# Patient Record
Sex: Male | Born: 1945 | Race: Black or African American | Hispanic: No | Smoking: Never smoker
Health system: Southern US, Community
[De-identification: ages and names within clinical notes are randomized; demographics above are authoritative.]

## PROBLEM LIST (undated history)

## (undated) DIAGNOSIS — E119 Type 2 diabetes mellitus without complications: Secondary | ICD-10-CM

## (undated) DIAGNOSIS — E785 Hyperlipidemia, unspecified: Secondary | ICD-10-CM

## (undated) DIAGNOSIS — N289 Disorder of kidney and ureter, unspecified: Secondary | ICD-10-CM

## (undated) DIAGNOSIS — D509 Iron deficiency anemia, unspecified: Secondary | ICD-10-CM

## (undated) DIAGNOSIS — E7800 Pure hypercholesterolemia, unspecified: Secondary | ICD-10-CM

## (undated) DIAGNOSIS — I10 Essential (primary) hypertension: Secondary | ICD-10-CM

## (undated) DIAGNOSIS — U071 COVID-19: Secondary | ICD-10-CM

## (undated) HISTORY — PX: INSERTION OF DIALYSIS CATHETER: SHX1324

## (undated) HISTORY — DX: Pure hypercholesterolemia, unspecified: E78.00

## (undated) HISTORY — PX: OTHER SURGICAL HISTORY: SHX169

## (undated) HISTORY — DX: Essential (primary) hypertension: I10

## (undated) HISTORY — PX: BRAIN SURGERY: SHX531

---

## 2017-01-28 ENCOUNTER — Encounter: Payer: Self-pay | Admitting: Physician Assistant

## 2017-01-28 ENCOUNTER — Ambulatory Visit (INDEPENDENT_AMBULATORY_CARE_PROVIDER_SITE_OTHER): Payer: Medicare Other | Admitting: Physician Assistant

## 2017-01-28 ENCOUNTER — Encounter: Payer: Self-pay | Admitting: *Deleted

## 2017-01-28 VITALS — BP 150/72 | HR 72 | Ht 71.0 in | Wt 248.0 lb

## 2017-01-28 DIAGNOSIS — E785 Hyperlipidemia, unspecified: Secondary | ICD-10-CM | POA: Diagnosis not present

## 2017-01-28 DIAGNOSIS — I890 Lymphedema, not elsewhere classified: Secondary | ICD-10-CM | POA: Diagnosis not present

## 2017-01-28 DIAGNOSIS — R079 Chest pain, unspecified: Secondary | ICD-10-CM | POA: Insufficient documentation

## 2017-01-28 DIAGNOSIS — E108 Type 1 diabetes mellitus with unspecified complications: Secondary | ICD-10-CM

## 2017-01-28 DIAGNOSIS — I10 Essential (primary) hypertension: Secondary | ICD-10-CM | POA: Diagnosis not present

## 2017-01-28 DIAGNOSIS — R0789 Other chest pain: Secondary | ICD-10-CM | POA: Diagnosis not present

## 2017-01-28 DIAGNOSIS — N186 End stage renal disease: Secondary | ICD-10-CM | POA: Diagnosis not present

## 2017-01-28 NOTE — Addendum Note (Signed)
Addended by: Levonne Hubert on: 01/28/2017 02:22 PM   Modules accepted: Orders

## 2017-01-28 NOTE — Patient Instructions (Signed)
Your physician recommends that you schedule a follow-up appointment in: 6 Weeks with Dr. Harl Bowie   Your physician recommends that you continue on your current medications as directed. Please refer to the Current Medication list given to you today.  Your physician has requested that you have an echocardiogram. Echocardiography is a painless test that uses sound waves to create images of your heart. It provides your doctor with information about the size and shape of your heart and how well your heart's chambers and valves are working. This procedure takes approximately one hour. There are no restrictions for this procedure.   Your physician has requested that you have a lexiscan myoview. For further information please visit [email or web address]. Please follow instruction sheet, as given.  If you need a refill on your cardiac medications before your next appointment, please call your pharmacy.  Thank you for choosing [facility]! '

## 2017-01-28 NOTE — Progress Notes (Signed)
Cardiology Office Note    Date:  01/28/2017   ID:  Theodore Nguyen, DOB 12-24-1945, MRN [id number]  PCP:  Theodore Hong, MD  Cardiologist: new  Chief Complaint  Patient presents with  . Chest Pain    History of Present Illness:  Theodore Nguyen is a 71 y.o. male patient referred from [facility] in [place], [place] for evaluation of chest painAnd weakness 12/31/16. Review of records sent show he has end-stage renal disease on hemodialysis, hypertension, DM, hyperlipidemia, chronic lower extremity edema. Chest x-ray 12/31/16 shows mild cardiomegaly and aortic atherosclerosis. EKG 12/31/16 normal sinus rhythm with LVH, left anterior fascicular block. He was given a prescription for meclizine and nitroglycerin and follow-up with [place]. Troponin was 0.08 TSH normal creatinine 7.13.  Patient comes in today alone and complains of sharp shooting chest pains that lasts 4-5 min. Occurs at rest. Sometimes goes under right rib.Has had for several years.Worse episode prompted ER visit. Lasted several days. Walks with a walker and no exertional symptoms. Denies heaviness or pressure. Complains of dyspnea on exertion but does very little. Has chronic lymphedema worse in his right leg but his left leg is wrapped. This limits any activity. Doesn't know his family history.  Past Medical History:  Diagnosis Date  . High cholesterol   . Hypertension     Past Surgical History:  Procedure Laterality Date  . left hand surgery    . right knee repair      Current Medications: Outpatient Medications Prior to Visit  Medication Sig Dispense Refill  . atorvastatin (LIPITOR) 40 MG tablet Take 40 mg by mouth daily.    Marland Kitchen b complex-vitamin c-folic acid (NEPHRO-VITE) 0.8 MG TABS tablet Take 1 tablet by mouth at bedtime.    . carvedilol (COREG) 12.5 MG tablet Take 12.5 mg by mouth 2 (two) times daily with a meal.    . cloNIDine (CATAPRES) 0.3 MG tablet Take 0.3 mg by mouth 2 (two) times daily.    .  enalapril (VASOTEC) 5 MG tablet Take 5 mg by mouth daily.    . furosemide (LASIX) 40 MG tablet Take 40 mg by mouth 2 (two) times daily.    . isosorbide mononitrate (IMDUR) 30 MG 24 hr tablet Take 30 mg by mouth daily.    Marland Kitchen levothyroxine (SYNTHROID, LEVOTHROID) 25 MCG tablet Take 25 mcg by mouth daily before breakfast.    . magnesium oxide (MAG-OX) 400 MG tablet Take 400 mg by mouth daily.    . pantoprazole (PROTONIX) 40 MG tablet Take 40 mg by mouth daily.    . vitamin B-12 (CYANOCOBALAMIN) 1000 MCG tablet Take 1,000 mcg by mouth daily.     No facility-administered medications prior to visit.      Allergies:   Patient has no known allergies.   Social History   Social History  . Marital status: Widowed    Spouse name: N/A  . Number of children: N/A  . Years of education: N/A   Social History Main Topics  . Smoking status: Never Smoker  . Smokeless tobacco: Never Used  . Alcohol use No  . Drug use: No  . Sexual activity: Not Asked   Other Topics Concern  . None   Social History Narrative  . None     Family History:  The patient's   family history includes Cancer in his mother.   ROS:   Please see the history of present illness.    Review of Systems  Constitution: Positive for weakness and  malaise/fatigue.  HENT: Negative.   Cardiovascular: Positive for dyspnea on exertion and leg swelling.  Respiratory: Negative.   Endocrine: Negative.   Hematologic/Lymphatic: Positive for adenopathy.  Skin: Positive for poor wound healing.  Musculoskeletal: Positive for joint swelling and muscle weakness.  Gastrointestinal: Negative.   Genitourinary: Negative.        On hemodialysis   All other systems reviewed and are negative.   PHYSICAL EXAM:   VS:  BP (!) 150/72 (BP Location: Left Arm)   Pulse 72   Ht 5\' 11"  (1.803 m)   Wt 248 lb (112.5 kg)   SpO2 99%   BMI 34.59 kg/m   Physical Exam  GEN: Obese, in a wheelchair, in no acute distress  Neck: no JVD, carotid bruits, or  masses Cardiac:RRR; positive S4, 2/6 systolic murmur at the apex  Respiratory:  clear to auscultation bilaterally, normal work of breathing GI: soft, nontender, nondistended, + BS Ext: Significant lymphedema of his right lower extremity up to his knees, left lymphedema but leg wrapped  Psych: euthymic mood, full affect  Wt Readings from Last 3 Encounters:  01/28/17 248 lb (112.5 kg)      Studies/Labs Reviewed:   EKG:  EKG is  ordered today.  The ekg ordered today demonstrates Normal sinus rhythm with LVH, poor R wave progression anteriorly nonspecific ST-T wave changes, no acute change  Recent Labs: No results found for requested labs within last 8760 hours.   Lipid Panel No results found for: CHOL, TRIG, HDL, CHOLHDL, VLDL, LDLCALC, LDLDIRECT  Additional studies/ records that were reviewed today include:   See dictation above for review of records   ASSESSMENT:    1. Other chest pain   2. ESRD (end stage renal disease) ([facility])   3. Essential hypertension   4. Type 1 diabetes mellitus with complications (H[facility])   5. Hyperlipidemia, unspecified hyperlipidemia type      PLAN:  In order of problems listed above:  Chest pain somewhat atypical but multiple cardiac risk factors for CAD, sedentary with end-stage renal disease and abnormal EKG. Recommend Lexi scan and 2-D echo. Discuss with Dr. branch who concurs. Follow-up with him in 4-6 weeks.  End-stage renal disease on hemodialysis Tuesday Thursday Saturday  Essential hypertension blood pressure is up a little today but he did not have dialysis today. He is unaware of his medications and his daughter left him here and took his meds with her  Diabetes mellitus  Hyperlipidemia on Lipitor  Lymphedema with left leg wrapped significant on his right lower extremity. Patient says he's had for 20 years.    Medication Adjustments/Labs and Tests Ordered: Current medicines are reviewed at length with the patient today.  Concerns  regarding medicines are outlined above.  Medication changes, Labs and Tests ordered today are listed in the Patient Instructions below. Patient Instructions  Your physician recommends that you schedule a follow-up appointment in: 6 Weeks with Dr. Harl Bowie   Your physician recommends that you continue on your current medications as directed. Please refer to the Current Medication list given to you today.  Your physician has requested that you have an echocardiogram. Echocardiography is a painless test that uses sound waves to create images of your heart. It provides your doctor with information about the size and shape of your heart and how well your heart's chambers and valves are working. This procedure takes approximately one hour. There are no restrictions for this procedure.   Your physician has requested that you have a lexiscan  myoview. For further information please visit [email or web address]. Please follow instruction sheet, as given.  If you need a refill on your cardiac medications before your next appointment, please call your pharmacy.  Thank you for choosing [facility]! '      Signed, Ermalinda Barrios, Hershal Coria  01/28/2017 1:59 PM    [facility] [address], [place], [place]  [address] Phone: [phone]; Fax: [phone]

## 2017-02-06 ENCOUNTER — Inpatient Hospital Stay (HOSPITAL_COMMUNITY): Admission: RE | Admit: 2017-02-06 | Payer: Medicare Other | Source: Ambulatory Visit

## 2017-02-06 ENCOUNTER — Other Ambulatory Visit (HOSPITAL_COMMUNITY): Payer: Medicare Other

## 2017-02-06 ENCOUNTER — Encounter (HOSPITAL_COMMUNITY): Payer: Medicare Other

## 2017-02-11 ENCOUNTER — Ambulatory Visit (HOSPITAL_COMMUNITY)
Admission: RE | Admit: 2017-02-11 | Discharge: 2017-02-11 | Disposition: A | Discharge status: Home / Self Care | Payer: Medicare Other | Source: Ambulatory Visit | Attending: Physician Assistant | Admitting: Physician Assistant

## 2017-02-11 ENCOUNTER — Encounter (HOSPITAL_COMMUNITY)
Admission: RE | Admit: 2017-02-11 | Discharge: 2017-02-11 | Disposition: A | Discharge status: Home / Self Care | Payer: Medicare Other | Source: Ambulatory Visit | Attending: Physician Assistant | Admitting: Physician Assistant

## 2017-02-11 ENCOUNTER — Inpatient Hospital Stay (HOSPITAL_COMMUNITY): Admission: RE | Admit: 2017-02-11 | Payer: Medicare Other | Source: Ambulatory Visit

## 2017-02-11 ENCOUNTER — Encounter (HOSPITAL_COMMUNITY): Payer: Self-pay

## 2017-02-11 DIAGNOSIS — R0789 Other chest pain: Secondary | ICD-10-CM

## 2017-02-11 DIAGNOSIS — I700 Atherosclerosis of aorta: Secondary | ICD-10-CM

## 2017-02-11 DIAGNOSIS — R072 Precordial pain: Secondary | ICD-10-CM | POA: Diagnosis not present

## 2017-02-11 LAB — NM MYOCAR MULTI W/SPECT W/WALL MOTION / EF
CHL CUP NUCLEAR SRS: 5
CHL CUP NUCLEAR SSS: 5
LHR: 0.43
LV dias vol: 162 mL (ref 62–150)
LV sys vol: 83 mL
NUC STRESS TID: 1.22
Peak HR: 123 {beats}/min
Rest HR: 85 {beats}/min
SDS: 0

## 2017-02-11 MED ORDER — TECHNETIUM TC 99M TETROFOSMIN IV KIT
10.0000 | PACK | Freq: Once | INTRAVENOUS | Status: AC | PRN
  Administered 2017-02-11: 10.8 via INTRAVENOUS

## 2017-02-11 MED ORDER — SODIUM CHLORIDE 0.9% FLUSH
INTRAVENOUS | Status: AC
  Administered 2017-02-11: 10 mL via INTRAVENOUS
  Filled 2017-02-11: qty 10

## 2017-02-11 MED ORDER — TECHNETIUM TC 99M TETROFOSMIN IV KIT
30.0000 | PACK | Freq: Once | INTRAVENOUS | Status: AC | PRN
  Administered 2017-02-11: 30 via INTRAVENOUS

## 2017-02-11 MED ORDER — REGADENOSON 0.4 MG/5ML IV SOLN
INTRAVENOUS | Status: AC
  Administered 2017-02-11: 0.4 mg via INTRAVENOUS
  Filled 2017-02-11: qty 5

## 2017-03-02 ENCOUNTER — Telehealth: Payer: Self-pay | Admitting: Physician Assistant

## 2017-03-02 NOTE — Telephone Encounter (Signed)
Theodore Nguyen is calling and would like to know if our office could give orders for patient to have physical therapy. Please call at [phone]. Thanks.

## 2017-03-02 NOTE — Telephone Encounter (Signed)
Returned call to [place] with [facility]-states they received a request from patients dialysis center for PT and they are unable to get orders from PCP as PCP is at the [place] in [place].   Also states dialysis will not provide orders for PT as well.  Reports patient is having trouble getting to and from the bus for transportation and also having trouble with transferring at dialysis.    Advised I would route to PA to see if ok to approve orders.  Verbalized understanding.

## 2017-03-03 NOTE — Telephone Encounter (Signed)
[name] made aware-verbal order, will fax over to have PA sign.

## 2017-03-03 NOTE — Telephone Encounter (Signed)
I'm fine if you want to order PT. It's not something I typically do so let me know what I need to do unless you can just send the order in.

## 2017-03-18 ENCOUNTER — Ambulatory Visit: Payer: Medicare Other | Admitting: Cardiology

## 2017-03-18 NOTE — Progress Notes (Deleted)
Clinical Summary Mr. Bade is a 71 y.o.male last seen by PA Lenze, this is our first visit together. He is seen for the following medical problems.    1. Chest pain - seen at [facility] for chest pain Jan 2018. Described as atypical symptoms.  - 02/2017 nuclear stress test with prior inferior scar, no active ischemia.    2. ESRD   3. HTN   4. DM2   5. Hyperlipidemia   6. Chronic lower extremity edema.  Past Medical History:  Diagnosis Date  . High cholesterol   . Hypertension      No Known Allergies   Current Outpatient Prescriptions  Medication Sig Dispense Refill  . atorvastatin (LIPITOR) 40 MG tablet Take 40 mg by mouth daily.    Marland Kitchen b complex-vitamin c-folic acid (NEPHRO-VITE) 0.8 MG TABS tablet Take 1 tablet by mouth at bedtime.    . cinacalcet (SENSIPAR) 30 MG tablet Take by mouth.    . cloNIDine (CATAPRES) 0.3 MG tablet Take 0.3 mg by mouth 2 (two) times daily.    . insulin aspart (NOVOLOG) 100 UNIT/ML FlexPen Using sliding scale. If 150-200, give 3 units, if 201-250, give 5 units, if 251-300 give 7 units, if 301-350, give 9 units, if 351-400, give 11 units, if >400, give 13 units    . insulin glargine (LANTUS) 100 UNIT/ML injection Inject into the skin at bedtime.    . isosorbide mononitrate (IMDUR) 30 MG 24 hr tablet Take 30 mg by mouth daily.    Marland Kitchen levothyroxine (SYNTHROID, LEVOTHROID) 25 MCG tablet Take 25 mcg by mouth daily before breakfast.    . omeprazole (PRILOSEC) 20 MG capsule Take 20 mg by mouth daily.    . sevelamer carbonate (RENVELA) 800 MG tablet Take 800 mg by mouth 3 (three) times daily with meals.    . vitamin B-12 (CYANOCOBALAMIN) 1000 MCG tablet Take 1,000 mcg by mouth daily.    . Vitamin D, Ergocalciferol, (DRISDOL) 50000 units CAPS capsule Take by mouth.     No current facility-administered medications for this visit.      Past Surgical History:  Procedure Laterality Date  . left hand surgery    . right knee repair       No  Known Allergies    Family History  Problem Relation Age of Onset  . Cancer Mother      Social History Mr. Canal reports that he has never smoked. He has never used smokeless tobacco. Mr. Betten reports that he does not drink alcohol.   Review of Systems CONSTITUTIONAL: No weight loss, fever, chills, weakness or fatigue.  HEENT: Eyes: No visual loss, blurred vision, double vision or yellow sclerae.No hearing loss, sneezing, congestion, runny nose or sore throat.  SKIN: No rash or itching.  CARDIOVASCULAR:  RESPIRATORY: No shortness of breath, cough or sputum.  GASTROINTESTINAL: No anorexia, nausea, vomiting or diarrhea. No abdominal pain or blood.  GENITOURINARY: No burning on urination, no polyuria NEUROLOGICAL: No headache, dizziness, syncope, paralysis, ataxia, numbness or tingling in the extremities. No change in bowel or bladder control.  MUSCULOSKELETAL: No muscle, back pain, joint pain or stiffness.  LYMPHATICS: No enlarged nodes. No history of splenectomy.  PSYCHIATRIC: No history of depression or anxiety.  ENDOCRINOLOGIC: No reports of sweating, cold or heat intolerance. No polyuria or polydipsia.  Marland Kitchen   Physical Examination There were no vitals filed for this visit. There were no vitals filed for this visit.  Gen: resting comfortably, no acute distress HEENT:  no scleral icterus, pupils equal round and reactive, no palptable cervical adenopathy,  CV Resp: Clear to auscultation bilaterally GI: abdomen is soft, non-tender, non-distended, normal bowel sounds, no hepatosplenomegaly MSK: extremities are warm, no edema.  Skin: warm, no rash Neuro:  no focal deficits Psych: appropriate affect   Diagnostic Studies  02/2017 Lexiscsan  There was no ST segment deviation noted during stress.  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  This is a low risk study. There is no myocardium currently at jeopardy.  Nuclear stress EF: 50%. LVEF is low  normal.   Assessment and Plan        Arnoldo Lenis, M.D., F.A.C.C.

## 2019-08-16 ENCOUNTER — Emergency Department (HOSPITAL_COMMUNITY): Payer: Medicare Other

## 2019-08-16 ENCOUNTER — Encounter (HOSPITAL_COMMUNITY): Payer: Self-pay

## 2019-08-16 ENCOUNTER — Other Ambulatory Visit: Payer: Self-pay

## 2019-08-16 ENCOUNTER — Inpatient Hospital Stay (HOSPITAL_COMMUNITY)
Admission: EM | Admit: 2019-08-16 | Discharge: 2019-08-26 | DRG: 177 | Disposition: A | Discharge status: Skilled Nursing Facility | Payer: Medicare Other | Source: Skilled Nursing Facility | Attending: Internal Medicine | Admitting: Internal Medicine

## 2019-08-16 DIAGNOSIS — Z794 Long term (current) use of insulin: Secondary | ICD-10-CM | POA: Diagnosis not present

## 2019-08-16 DIAGNOSIS — E875 Hyperkalemia: Secondary | ICD-10-CM | POA: Diagnosis not present

## 2019-08-16 DIAGNOSIS — N2581 Secondary hyperparathyroidism of renal origin: Secondary | ICD-10-CM | POA: Diagnosis not present

## 2019-08-16 DIAGNOSIS — I120 Hypertensive chronic kidney disease with stage 5 chronic kidney disease or end stage renal disease: Secondary | ICD-10-CM | POA: Diagnosis present

## 2019-08-16 DIAGNOSIS — J1289 Other viral pneumonia: Secondary | ICD-10-CM | POA: Diagnosis present

## 2019-08-16 DIAGNOSIS — J90 Pleural effusion, not elsewhere classified: Secondary | ICD-10-CM

## 2019-08-16 DIAGNOSIS — E8770 Fluid overload, unspecified: Secondary | ICD-10-CM | POA: Diagnosis present

## 2019-08-16 DIAGNOSIS — I10 Essential (primary) hypertension: Secondary | ICD-10-CM | POA: Diagnosis not present

## 2019-08-16 DIAGNOSIS — N186 End stage renal disease: Secondary | ICD-10-CM | POA: Diagnosis present

## 2019-08-16 DIAGNOSIS — R0602 Shortness of breath: Secondary | ICD-10-CM | POA: Diagnosis present

## 2019-08-16 DIAGNOSIS — D631 Anemia in chronic kidney disease: Secondary | ICD-10-CM

## 2019-08-16 DIAGNOSIS — E7800 Pure hypercholesterolemia, unspecified: Secondary | ICD-10-CM | POA: Diagnosis present

## 2019-08-16 DIAGNOSIS — U071 COVID-19: Secondary | ICD-10-CM | POA: Diagnosis present

## 2019-08-16 DIAGNOSIS — E1122 Type 2 diabetes mellitus with diabetic chronic kidney disease: Secondary | ICD-10-CM

## 2019-08-16 DIAGNOSIS — L89302 Pressure ulcer of unspecified buttock, stage 2: Secondary | ICD-10-CM | POA: Diagnosis not present

## 2019-08-16 DIAGNOSIS — Z992 Dependence on renal dialysis: Secondary | ICD-10-CM

## 2019-08-16 DIAGNOSIS — L89152 Pressure ulcer of sacral region, stage 2: Secondary | ICD-10-CM | POA: Diagnosis present

## 2019-08-16 DIAGNOSIS — L8942 Pressure ulcer of contiguous site of back, buttock and hip, stage 2: Secondary | ICD-10-CM | POA: Diagnosis not present

## 2019-08-16 DIAGNOSIS — E785 Hyperlipidemia, unspecified: Secondary | ICD-10-CM | POA: Diagnosis present

## 2019-08-16 DIAGNOSIS — L8990 Pressure ulcer of unspecified site, unspecified stage: Secondary | ICD-10-CM | POA: Insufficient documentation

## 2019-08-16 DIAGNOSIS — F329 Major depressive disorder, single episode, unspecified: Secondary | ICD-10-CM | POA: Diagnosis not present

## 2019-08-16 DIAGNOSIS — J9601 Acute respiratory failure with hypoxia: Secondary | ICD-10-CM | POA: Diagnosis present

## 2019-08-16 DIAGNOSIS — I959 Hypotension, unspecified: Secondary | ICD-10-CM

## 2019-08-16 DIAGNOSIS — N189 Chronic kidney disease, unspecified: Secondary | ICD-10-CM

## 2019-08-16 HISTORY — DX: Disorder of kidney and ureter, unspecified: N28.9

## 2019-08-16 HISTORY — DX: Hyperlipidemia, unspecified: E78.5

## 2019-08-16 HISTORY — DX: Iron deficiency anemia, unspecified: D50.9

## 2019-08-16 HISTORY — DX: Type 2 diabetes mellitus without complications: E11.9

## 2019-08-16 LAB — CBC WITH DIFFERENTIAL/PLATELET
Abs Immature Granulocytes: 0.01 10*3/uL (ref 0.00–0.07)
Basophils Absolute: 0 10*3/uL (ref 0.0–0.1)
Basophils Relative: 0 %
Eosinophils Absolute: 0 10*3/uL (ref 0.0–0.5)
Eosinophils Relative: 0 %
HCT: 38.3 % — ABNORMAL LOW (ref 39.0–52.0)
Hemoglobin: 10.1 g/dL — ABNORMAL LOW (ref 13.0–17.0)
Immature Granulocytes: 0 %
Lymphocytes Relative: 8 %
Lymphs Abs: 0.3 10*3/uL — ABNORMAL LOW (ref 0.7–4.0)
MCH: 26.4 pg (ref 26.0–34.0)
MCHC: 26.4 g/dL — ABNORMAL LOW (ref 30.0–36.0)
MCV: 100 fL (ref 80.0–100.0)
Monocytes Absolute: 0.2 10*3/uL (ref 0.1–1.0)
Monocytes Relative: 6 %
Neutro Abs: 3 10*3/uL (ref 1.7–7.7)
Neutrophils Relative %: 86 %
Platelets: 147 10*3/uL — ABNORMAL LOW (ref 150–400)
RBC: 3.83 MIL/uL — ABNORMAL LOW (ref 4.22–5.81)
RDW: 17 % — ABNORMAL HIGH (ref 11.5–15.5)
WBC: 3.5 10*3/uL — ABNORMAL LOW (ref 4.0–10.5)
nRBC: 0 % (ref 0.0–0.2)

## 2019-08-16 LAB — LACTIC ACID, PLASMA: Lactic Acid, Venous: 0.5 mmol/L (ref 0.5–1.9)

## 2019-08-16 LAB — COMPREHENSIVE METABOLIC PANEL
ALT: 9 U/L (ref 0–44)
AST: 12 U/L — ABNORMAL LOW (ref 15–41)
Albumin: 2.6 g/dL — ABNORMAL LOW (ref 3.5–5.0)
Alkaline Phosphatase: 100 U/L (ref 38–126)
Anion gap: 10 (ref 5–15)
BUN: 24 mg/dL — ABNORMAL HIGH (ref 8–23)
CO2: 27 mmol/L (ref 22–32)
Calcium: 8.2 mg/dL — ABNORMAL LOW (ref 8.9–10.3)
Chloride: 99 mmol/L (ref 98–111)
Creatinine, Ser: 3.33 mg/dL — ABNORMAL HIGH (ref 0.61–1.24)
GFR calc Af Amer: 20 mL/min — ABNORMAL LOW (ref >60)
GFR calc non Af Amer: 17 mL/min — ABNORMAL LOW (ref >60)
Glucose, Bld: 75 mg/dL (ref 70–99)
Potassium: 4.4 mmol/L (ref 3.5–5.1)
Sodium: 136 mmol/L (ref 135–145)
Total Bilirubin: 0.7 mg/dL (ref 0.3–1.2)
Total Protein: 6.1 g/dL — ABNORMAL LOW (ref 6.5–8.1)

## 2019-08-16 LAB — SARS CORONAVIRUS 2 BY RT PCR (HOSPITAL ORDER, PERFORMED IN ~~LOC~~ HOSPITAL LAB): SARS Coronavirus 2: POSITIVE — AB

## 2019-08-16 LAB — BRAIN NATRIURETIC PEPTIDE: B Natriuretic Peptide: 215 pg/mL — ABNORMAL HIGH (ref 0.0–100.0)

## 2019-08-16 MED ORDER — DEXAMETHASONE SODIUM PHOSPHATE 10 MG/ML IJ SOLN
6.0000 mg | INTRAMUSCULAR | Status: DC
  Administered 2019-08-16 – 2019-08-22 (×7): 6 mg via INTRAVENOUS
  Filled 2019-08-16 (×7): qty 1

## 2019-08-16 MED ORDER — ONDANSETRON HCL 4 MG PO TABS: 4.0000 mg | ORAL_TABLET | Freq: Four times a day (QID) | ORAL | Status: DC | PRN

## 2019-08-16 MED ORDER — ENSURE ENLIVE PO LIQD
237.0000 mL | Freq: Two times a day (BID) | ORAL | Status: DC
  Administered 2019-08-17: 237 mL via ORAL

## 2019-08-16 MED ORDER — PANTOPRAZOLE SODIUM 40 MG PO TBEC
40.0000 mg | DELAYED_RELEASE_TABLET | Freq: Every day | ORAL | Status: DC
  Administered 2019-08-17 – 2019-08-26 (×10): 40 mg via ORAL
  Filled 2019-08-16 (×10): qty 1

## 2019-08-16 MED ORDER — HEPARIN SODIUM (PORCINE) 5000 UNIT/ML IJ SOLN
5000.0000 [IU] | Freq: Three times a day (TID) | INTRAMUSCULAR | Status: DC
  Administered 2019-08-16 – 2019-08-26 (×28): 5000 [IU] via SUBCUTANEOUS
  Filled 2019-08-16 (×28): qty 1

## 2019-08-16 MED ORDER — ACETAMINOPHEN 325 MG PO TABS
650.0000 mg | ORAL_TABLET | Freq: Four times a day (QID) | ORAL | Status: DC | PRN
  Administered 2019-08-16 – 2019-08-25 (×4): 650 mg via ORAL
  Filled 2019-08-16 (×4): qty 2

## 2019-08-16 MED ORDER — ONDANSETRON HCL 4 MG/2ML IJ SOLN: 4.0000 mg | Freq: Four times a day (QID) | INTRAMUSCULAR | Status: DC | PRN

## 2019-08-16 MED ORDER — ACETAMINOPHEN 650 MG RE SUPP: 650.0000 mg | Freq: Four times a day (QID) | RECTAL | Status: DC | PRN

## 2019-08-16 MED ORDER — SERTRALINE HCL 25 MG PO TABS
25.0000 mg | ORAL_TABLET | Freq: Every day | ORAL | Status: DC
  Administered 2019-08-17 – 2019-08-26 (×10): 25 mg via ORAL
  Filled 2019-08-16 (×10): qty 1

## 2019-08-16 MED ORDER — GABAPENTIN 100 MG PO CAPS
100.0000 mg | ORAL_CAPSULE | Freq: Three times a day (TID) | ORAL | Status: DC
  Administered 2019-08-16 – 2019-08-26 (×29): 100 mg via ORAL
  Filled 2019-08-16 (×29): qty 1

## 2019-08-16 MED ORDER — ASPIRIN EC 81 MG PO TBEC
81.0000 mg | DELAYED_RELEASE_TABLET | Freq: Every day | ORAL | Status: DC
  Administered 2019-08-17 – 2019-08-26 (×10): 81 mg via ORAL
  Filled 2019-08-16 (×10): qty 1

## 2019-08-16 MED ORDER — SODIUM CHLORIDE 0.9 % IV BOLUS
250.0000 mL | Freq: Once | INTRAVENOUS | Status: AC
  Administered 2019-08-16: 15:00:00 250 mL via INTRAVENOUS

## 2019-08-16 MED ORDER — ATORVASTATIN CALCIUM 40 MG PO TABS
40.0000 mg | ORAL_TABLET | Freq: Every day | ORAL | Status: DC
  Administered 2019-08-17 – 2019-08-26 (×10): 40 mg via ORAL
  Filled 2019-08-16 (×10): qty 1

## 2019-08-16 NOTE — ED Notes (Signed)
Patient transported to CT 

## 2019-08-16 NOTE — H&P (Addendum)
History and Physical    Theodore Nguyen [id number] DOB: 11/05/1946 DOA: 08/16/2019  PCP: Eber Hong, MD   Patient coming from: SNF.   Chief Complaint: Hypotension on hemodialysis.   HPI: Theodore Nguyen is a 73 y.o. male with medical history significant of type 2 diabetes mellitus, end-stage renal disease on hemodialysis, (Tuesday Thursday, Saturday), hypertension, dyslipidemia and iron deficiency anemia.  Patient lives at a nursing facility where multiple residents have been tested positive for COVID-19.  He has been nonambulatory for about a year due to generalized weakness.   He reports not feeling well for about 2 days, generalized weakness, moderate to severe intensity, no improving or worsening factors, no associated cough, fevers or chills.  He does have mild dyspnea.  Today he went to hemodialysis where he was found hypotensive while receiving hemodialysis.  EMS was called and reported patient oxygen saturation 83% on room air.  His hemodialysis was stopped prematurely and transferred to the hospital for further evaluation.   ED Course: Patient was hypotensive 93/54, oxygen saturation was 88% on room air, he tested positive for SARS COVID-19.  Referred for admission for evaluation.  Review of Systems:  1. General: No fevers, no chills, no weight gain or weight loss 2. ENT: No runny nose or sore throat, no hearing disturbances 3. Pulmonary: Positive dyspnea, but no cough, wheezing, or hemoptysis 4. Cardiovascular: No angina, claudication, lower extremity edema, pnd or orthopnea 5. Gastrointestinal: mild nausea but no vomiting, no diarrhea or constipation 6. Hematology: No easy bruisability or frequent infections 7. Urology: No dysuria, hematuria or increased urinary frequency 8. Dermatology: No rashes. 9. Neurology: No seizures or paresthesias 10. Musculoskeletal: No joint pain or deformities  Past Medical History:  Diagnosis Date  . Diabetes mellitus without  complication ([place])   . High cholesterol   . Hyperlipidemia   . Hypertension   . Iron deficiency anemia   . Renal disorder    dialysis    Past Surgical History:  Procedure Laterality Date  . INSERTION OF DIALYSIS CATHETER    . left hand surgery    . right knee repair       reports that he has never smoked. He has never used smokeless tobacco. He reports that he does not drink alcohol or use drugs.  No Known Allergies  Family History  Problem Relation Age of Onset  . Cancer Mother      Prior to Admission medications   Medication Sig Start Date End Date Taking? Authorizing Provider  acetaminophen (TYLENOL 8 HOUR) 650 MG CR tablet Take 650 mg by mouth every 6 (six) hours.   Yes [provider]  amLODipine (NORVASC) 5 MG tablet Take 5 mg by mouth daily.   Yes [provider]  aspirin EC 81 MG tablet Take 81 mg by mouth daily.   Yes [provider]  atorvastatin (LIPITOR) 40 MG tablet Take 40 mg by mouth daily.   Yes [provider]  b complex-vitamin c-folic acid (NEPHRO-VITE) 0.8 MG TABS tablet Take 1 tablet by mouth every morning.    Yes [provider]  carvedilol (COREG) 6.25 MG tablet Take 6.25 mg by mouth daily.   Yes [provider]  Cholecalciferol (VITAMIN D3) 50 MCG (2000 UT) TABS Take 2,000 Units by mouth daily.   Yes [provider]  gabapentin (NEURONTIN) 100 MG capsule Take 100 mg by mouth 3 (three) times daily.   Yes [provider]  isosorbide mononitrate (IMDUR) 30 MG 24 hr tablet Take  30 mg by mouth daily. HOLD FOR SYST9OLIC LEVELS [id number]   Yes [provider]  losartan (COZAAR) 50 MG tablet Take 50 mg by mouth every Monday, Wednesday, and Friday.   Yes [provider]  Multiple Vitamin (MULTIVITAMIN WITH MINERALS) TABS tablet Take 1 tablet by mouth daily.   Yes [provider]  omeprazole (PRILOSEC) 40 MG capsule Take 40 mg by mouth 2 (two) times daily before a meal.     Yes [provider]  ondansetron (ZOFRAN) 4 MG tablet Take 4 mg by mouth every 8 (eight) hours as needed for nausea or vomiting.   Yes [provider]  sertraline (ZOLOFT) 25 MG tablet Take 25 mg by mouth daily.   Yes [provider]    Physical Exam: Vitals:   08/16/19 1630 08/16/19 1700 08/16/19 1730 08/16/19 1836  BP: 107/75 96/67 96/65  121/71  Pulse:  (!) 57 (!) 48 76  Resp: 11 12 11 14   Temp:    98 F (36.7 C)  TempSrc:    Oral  SpO2:  96% 92% 93%  Weight:      Height:        Vitals:   08/16/19 1630 08/16/19 1700 08/16/19 1730 08/16/19 1836  BP: 107/75 96/67 96/65  121/71  Pulse:  (!) 57 (!) 48 76  Resp: 11 12 11 14   Temp:    98 F (36.7 C)  TempSrc:    Oral  SpO2:  96% 92% 93%  Weight:      Height:       General: deconditioned and ill looking appearing.  Neurology: Awake and alert, non focal Head and Neck. Head normocephalic. Neck supple with no adenopathy or thyromegaly.   E ENT: mild pallor, no icterus, oral mucosa moist Cardiovascular: No JVD. S1-S2 present, rhythmic, no gallops, rubs, or murmurs. Trace bilateral lower extremity edema. Pulmonary: positive breath sounds bilaterally, decreased air movement at the left base, no wheezing, rhonchi or rales. Gastrointestinal. Abdomen with no organomegaly, non tender, no rebound or guarding Skin. No rashes Musculoskeletal: no joint deformities    Labs on Admission: I have personally reviewed following labs and imaging studies  CBC: Recent Labs  Lab 08/16/19 1502  WBC 3.5*  NEUTROABS 3.0  HGB 10.1*  HCT 38.3*  MCV 100.0  PLT [id number]*   Basic Metabolic Panel: Recent Labs  Lab 08/16/19 1502  NA 136  K 4.4  CL 99  CO2 27  GLUCOSE 75  BUN 24*  CREATININE 3.33*  CALCIUM 8.2*   GFR: Estimated Creatinine Clearance: 24.1 mL/min (A) (by C-G formula based on SCr of 3.33 mg/dL (H)). Liver Function Tests: Recent Labs  Lab 08/16/19 1502  AST 12*  ALT 9  ALKPHOS 100  BILITOT 0.7   PROT 6.1*  ALBUMIN 2.6*   No results for input(s): LIPASE, AMYLASE in the last 168 hours. No results for input(s): AMMONIA in the last 168 hours. Coagulation Profile: No results for input(s): INR, PROTIME in the last 168 hours. Cardiac Enzymes: No results for input(s): CKTOTAL, CKMB, CKMBINDEX, TROPONINI in the last 168 hours. BNP (last 3 results) No results for input(s): PROBNP in the last 8760 hours. HbA1C: No results for input(s): HGBA1C in the last 72 hours. CBG: No results for input(s): GLUCAP in the last 168 hours. Lipid Profile: No results for input(s): CHOL, HDL, LDLCALC, TRIG, CHOLHDL, LDLDIRECT in the last 72 hours. Thyroid Function Tests: No results for input(s): TSH, T4TOTAL, FREET4, T3FREE, THYROIDAB in the last 72 hours. Anemia Panel:  No results for input(s): VITAMINB12, FOLATE, FERRITIN, TIBC, IRON, RETICCTPCT in the last 72 hours. Urine analysis: No results found for: COLORURINE, APPEARANCEUR, [place], [place], GLUCOSEU, HGBUR, BILIRUBINUR, KETONESUR, PROTEINUR, UROBILINOGEN, NITRITE, LEUKOCYTESUR  Radiological Exams on Admission: Ct Chest Wo Contrast  Result Date: 08/16/2019 CLINICAL DATA:  Chest pain, shortness of breath. EXAM: CT CHEST WITHOUT CONTRAST TECHNIQUE: Multidetector CT imaging of the chest was performed following the standard protocol without IV contrast. COMPARISON:  Radiographs of same day. FINDINGS: Cardiovascular: Atherosclerosis of thoracic aorta is noted. 4 cm ascending thoracic aortic aneurysm is noted. Dissection could not be evaluated for due to lack of intravenous contrast. Mild cardiomegaly is noted. No pericardial effusion is noted. Extensive coronary artery calcifications are noted. Mediastinum/Nodes: No enlarged mediastinal or axillary lymph nodes. Thyroid gland, trachea, and esophagus demonstrate no significant findings. Lungs/Pleura: No pneumothorax is noted. Large left pleural effusion is noted with complete atelectasis of the left lower lobe  and significant atelectasis of the left upper lobe. Mild right pleural effusion is noted with adjacent atelectasis of the right lower lobe. Upper Abdomen: No acute abnormality. Musculoskeletal: Increased sclerosis of visualized skeleton is noted consistent with renal osteodystrophy. IMPRESSION: Large left pleural effusion is noted with complete atelectasis of the left lower lobe and significant atelectasis of the left upper lobe. Small right pleural effusion is noted with adjacent subsegmental atelectasis of the right lower lobe. 4 cm ascending thoracic aortic aneurysm. Recommend annual imaging followup by CTA or MRA. This recommendation follows 2010 ACCF/AHA/AATS/ACR/ASA/SCA/SCAI/SIR/STS/SVM Guidelines for the Diagnosis and Management of Patients with Thoracic Aortic Disease. Circulation. 2010; [id number]. Aortic aneurysm NOS (ICD10-I71.9). Extensive coronary artery calcifications are noted concerning for coronary artery disease. Aortic Atherosclerosis (ICD10-I70.0). Electronically Signed   By: Marijo Conception M.D.   On: 08/16/2019 15:47   Dg Chest Portable 1 View  Result Date: 08/16/2019 CLINICAL DATA:  Shortness of breath EXAM: PORTABLE CHEST 1 VIEW COMPARISON:  02/20/2017 FINDINGS: Cardiac shadow is enlarged but stable. Aortic calcifications are noted. Large left-sided pleural effusion is noted with likely underlying atelectasis/infiltrate. No bony abnormality is seen. IMPRESSION: Large left-sided pleural effusion. Likely underlying atelectasis/infiltrate is present. Electronically Signed   By: Inez Catalina M.D.   On: 08/16/2019 11:14    EKG: Independently reviewed. NA  Assessment/Plan Principal Problem:   COVID-19 virus infection Active Problems:   Volume overload   ESRD (end stage renal disease) (HCC)   Essential hypertension   Type 2 diabetes mellitus with chronic kidney disease on chronic dialysis (HCC)   Anemia due to chronic kidney disease    73 year old male who is nursing home  resident, nonambulatory for the last year, end-stage renal disease on hemodialysis, type 2 diabetes mellitus and hypertension.  Reports 2 days of generalized malaise, progressive weakness, no fevers, no chills, no significant dyspnea.  Today during hemodialysis he was hypotensive and hypoxic.  On his initial physical examination blood pressure 93/54, heart rate 82, respiratory rate 14, saturation 88% on room air.  Decreased breath sounds bilaterally, heart S1-S2 present rhythmic, abdomen soft, trace lower extremity edema.  Sodium 136, potassium 4.4, chloride 99, bicarb 27, glucose 75, BUN 24, creatinine 3.3, white count 3.5, hemoglobin 10.1, hematocrit 38.3, platelets 147.  SARS COVID-19 positive.  Chest radiograph with large left pleural effusion, small right pleural effusion, congestive hilar regions.  CT chest with left pleural effusion with left lower lobe and left upper lobe atelectasis.  Patient will be admitted to the hospital with the working diagnosis of acute hypoxic respiratory failure  due to volume overload in the setting of end-stage renal disease and acute SARS COVID-19 infection.  1.  Acute hypoxic respiratory failure in the setting of ESRD on HD.  Positive volume overload likely due to end-stage renal disease, unable to undergo ultrafiltration due to hypotension.  Will continue supplemental oxygen per nasal cannula, target O2 saturation greater 92%.  Will order ultrasound-guided thoracentesis.  Consult nephrology in a.m. for possible ultrafiltration.  I think his hypoxemia is due to atelectasis, pulmonary edema and pleural effusion more than a viral pneumonia.  2.  SARS COVID-19 infection.  Patient has been symptomatic for 2 days, his hypoxemia can be explained by volume overload pulmonary edema, atelectasis and pleural effusions at the same time patient does have criteria for treatment due to hypoxemia, and has risk factors for poor outcomes, will give systemic steroids and remdesivir, with  the intention of rapid de-escalation if his symptoms improved after volume management. Check inflammatory markers per protocol.   3.  Hypertension.  Will hold on antihypertensive agents, at home on amlodipine, carvedilol, isosorbide and losartan.  4.  Dyslipidemia.  Continue atorvastatin.  5.  Depression.  Continue sertraline   DVT prophylaxis: heparin  Code Status: full  Family Communication: no family at the bedside   Disposition Plan: Stepdown unit.  Consults called:  None   Admission status: Inpatient, transfer to [facility].     Kynsli Haapala Gerome Apley MD [facility]   08/16/2019, 7:04 PM

## 2019-08-16 NOTE — ED Triage Notes (Addendum)
EMS reports pt was at dialysis and was called out for c/o generalized weakness and hypotension.  Reports bp 92/44 at dialysis and ems says bp was 77/53.  Pt received 1 hour and 3 min of dialysis today with no fluid removal.  Staff also says pt not as talkative as usual.  Dialysis gave 12.5mg  phergan for nausea.  PT also c/o r sided pain.  Pt is a resident at [facility] and is tested weekly for covid.  Reports last test was negative.  EMS reports room air o2 sat was 83%.  EMS put pt on nrb and sat increased to 100%.  Pt denies sob or cough.   CBG 108

## 2019-08-16 NOTE — ED Notes (Signed)
Pt put on 2L of oxygen [place] due to decreased oxygen saturation. Pt was 88% on RA, not pt is 100% on 2L [place]

## 2019-08-16 NOTE — ED Provider Notes (Signed)
Pt with generalized weakness. Missed HD today because of it and noted to be hypotensive. Last had HD Saturday. Through ED stay has continued to have borderline hypotension. Pt reports normally hypertensive. Afebrile but is COVID positive and imaging with large L pleural effusion of unknown chronicity. o2 sats borderline on RA around 90-94%. Will discuss with hospitalist for admission.   Tried calling daughter Ronny Bacon "Anic[employer] Boss" at patient's request w/o answer. [phone].   Virgel Manifold, MD 08/16/19 [phone]

## 2019-08-16 NOTE — ED Provider Notes (Signed)
[facility] EMERGENCY DEPARTMENT Provider Note   CSN: [id number] Arrival date & time: 08/16/19  1016     History   Chief Complaint Chief Complaint  Patient presents with  . Hypotension    HPI Lumumba Loertscher is a 73 y.o. male.     Patient has a history of diabetes hypertension and is a dialysis patient.  He was sent over to the emergency department for weakness and low blood pressure.  Patient was getting his dialysis  The history is provided by the patient and the EMS personnel. No language interpreter was used.  Weakness Severity:  Moderate Onset quality:  Sudden Timing:  Constant Progression:  Worsening Chronicity:  New Context: not alcohol use   Relieved by:  Nothing Worsened by:  Nothing Ineffective treatments:  None tried Associated symptoms: no abdominal pain, no chest pain, no cough, no diarrhea, no frequency, no headaches and no seizures     Past Medical History:  Diagnosis Date  . Diabetes mellitus without complication ([place])   . High cholesterol   . Hyperlipidemia   . Hypertension   . Iron deficiency anemia   . Renal disorder    dialysis    Patient Active Problem List   Diagnosis Date Noted  . Chest pain 01/28/2017    Past Surgical History:  Procedure Laterality Date  . INSERTION OF DIALYSIS CATHETER    . left hand surgery    . right knee repair          Home Medications    Prior to Admission medications   Medication Sig Start Date End Date Taking? Authorizing Provider  atorvastatin (LIPITOR) 40 MG tablet Take 40 mg by mouth daily.   Yes [provider]  b complex-vitamin c-folic acid (NEPHRO-VITE) 0.8 MG TABS tablet Take 1 tablet by mouth at bedtime.   Yes [provider]  cinacalcet (SENSIPAR) 30 MG tablet Take by mouth.    [provider]  cloNIDine (CATAPRES) 0.3 MG tablet Take 0.3 mg by mouth 2 (two) times daily.    [provider]  insulin aspart (NOVOLOG) 100 UNIT/ML FlexPen Using sliding scale.  If 150-200, give 3 units, if 201-250, give 5 units, if 251-300 give 7 units, if 301-350, give 9 units, if 351-400, give 11 units, if >400, give 13 units 03/22/13   [provider]  insulin glargine (LANTUS) 100 UNIT/ML injection Inject into the skin at bedtime.    [provider]  isosorbide mononitrate (IMDUR) 30 MG 24 hr tablet Take 30 mg by mouth daily.    [provider]  levothyroxine (SYNTHROID, LEVOTHROID) 25 MCG tablet Take 25 mcg by mouth daily before breakfast.    [provider]  omeprazole (PRILOSEC) 20 MG capsule Take 20 mg by mouth daily.    [provider]  sevelamer carbonate (RENVELA) 800 MG tablet Take 800 mg by mouth 3 (three) times daily with meals.    [provider]  vitamin B-12 (CYANOCOBALAMIN) 1000 MCG tablet Take 1,000 mcg by mouth daily.    [provider]  Vitamin D, Ergocalciferol, (DRISDOL) 50000 units CAPS capsule Take by mouth. 03/22/13   [provider]    Family History Family History  Problem Relation Age of Onset  . Cancer Mother     Social History Social History   Tobacco Use  . Smoking status: Never Smoker  . Smokeless tobacco: Never Used  Substance Use Topics  . Alcohol use: No  . Drug use: No  Allergies   Patient has no known allergies.   Review of Systems Review of Systems  Constitutional: Negative for appetite change and fatigue.  HENT: Negative for congestion, ear discharge and sinus pressure.   Eyes: Negative for discharge.  Respiratory: Negative for cough.   Cardiovascular: Negative for chest pain.  Gastrointestinal: Negative for abdominal pain and diarrhea.  Genitourinary: Negative for frequency and hematuria.  Musculoskeletal: Negative for back pain.  Skin: Negative for rash.  Neurological: Positive for weakness. Negative for seizures and headaches.  Psychiatric/Behavioral: Negative for hallucinations.     Physical Exam Updated Vital Signs BP 110/67    Pulse 80   Temp (!) 97.5 F (36.4 C) (Oral)   Resp 11   Wt 95.5 kg   SpO2 99%   BMI 29.36 kg/m   Physical Exam Vitals signs and nursing note reviewed.  Constitutional:      Appearance: He is well-developed.  HENT:     Head: Normocephalic.     Nose: Nose normal.  Eyes:     General: No scleral icterus.    Conjunctiva/sclera: Conjunctivae normal.  Neck:     Musculoskeletal: Neck supple.     Thyroid: No thyromegaly.  Cardiovascular:     Rate and Rhythm: Normal rate and regular rhythm.     Heart sounds: No murmur. No friction rub. No gallop.   Pulmonary:     Breath sounds: No stridor. No wheezing or rales.  Chest:     Chest wall: No tenderness.  Abdominal:     General: There is no distension.     Tenderness: There is no abdominal tenderness. There is no rebound.  Musculoskeletal: Normal range of motion.  Lymphadenopathy:     Cervical: No cervical adenopathy.  Skin:    Findings: No erythema or rash.  Neurological:     Mental Status: He is oriented to person, place, and time.     Motor: No abnormal muscle tone.     Coordination: Coordination normal.  Psychiatric:        Behavior: Behavior normal.      ED Treatments / Results  Labs (all labs ordered are listed, but only abnormal results are displayed) Labs Reviewed  SARS CORONAVIRUS 2 (HOSPITAL ORDER, [place] [facility]) - Abnormal; Notable for the following components:      Result Value   SARS Coronavirus 2 POSITIVE (*)    All other components within normal limits  CBC WITH DIFFERENTIAL/PLATELET  COMPREHENSIVE METABOLIC PANEL  BRAIN NATRIURETIC PEPTIDE  LACTIC ACID, PLASMA    EKG None  Radiology Dg Chest Portable 1 View  Result Date: 08/16/2019 CLINICAL DATA:  Shortness of breath EXAM: PORTABLE CHEST 1 VIEW COMPARISON:  02/20/2017 FINDINGS: Cardiac shadow is enlarged but stable. Aortic calcifications are noted. Large left-sided pleural effusion is noted with likely underlying  atelectasis/infiltrate. No bony abnormality is seen. IMPRESSION: Large left-sided pleural effusion. Likely underlying atelectasis/infiltrate is present. Electronically Signed   By: Inez Catalina M.D.   On: 08/16/2019 11:14    Procedures Procedures (including critical care time)  Medications Ordered in ED Medications - No data to display Orie Worstell was evaluated in Emergency Department on 08/16/2019 for the symptoms described in the history of present illness. He was evaluated in the context of the global COVID-19 pandemic, which necessitated consideration that the patient might be at risk for infection with the SARS-CoV-2 virus that causes COVID-19. Institutional protocols and algorithms that pertain to the evaluation of patients at risk for COVID-19 are in  a state of rapid change based on information released by regulatory bodies including the CDC and federal and state organizations. These policies and algorithms were followed during the patient's care in the ED. CRITICAL CARE Performed by: Milton Ferguson Total critical care time: 35 minutes Critical care time was exclusive of separately billable procedures and treating other patients. Critical care was necessary to treat or prevent imminent or life-threatening deterioration. Critical care was time spent personally by me on the following activities: development of treatment plan with patient and/or surrogate as well as nursing, discussions with consultants, evaluation of patient's response to treatment, examination of patient, obtaining history from patient or surrogate, ordering and performing treatments and interventions, ordering and review of laboratory studies, ordering and review of radiographic studies, pulse oximetry and re-evaluation of patient's condition.   Initial Impression / Assessment and Plan / ED Course  I have reviewed the triage vital signs and the nursing notes.  Pertinent labs & imaging results that were available during my  care of the patient were reviewed by me and considered in my medical decision making (see chart for details).   Labs pending.  Patient's hypotension has resolved on its own.  And patient is not hypoxic.  COVID test is positive.  Chest x-ray shows large pleural effusion.   Disposition will be determined after labs and CT scan.  Most likely admission to [place] by hospitalist   Final Clinical Impressions(s) / ED Diagnoses   Final diagnoses:  None    ED Discharge Orders    None       Milton Ferguson, MD 08/16/19 1418

## 2019-08-17 ENCOUNTER — Inpatient Hospital Stay (HOSPITAL_COMMUNITY): Payer: Medicare Other

## 2019-08-17 ENCOUNTER — Encounter (HOSPITAL_COMMUNITY): Payer: Self-pay | Admitting: Radiology

## 2019-08-17 DIAGNOSIS — E8770 Fluid overload, unspecified: Secondary | ICD-10-CM

## 2019-08-17 DIAGNOSIS — L8990 Pressure ulcer of unspecified site, unspecified stage: Secondary | ICD-10-CM | POA: Insufficient documentation

## 2019-08-17 HISTORY — PX: IR THORACENTESIS RIGHT ASP PLEURAL SPACE W/IMG GUIDE: IMG5380

## 2019-08-17 LAB — BODY FLUID CELL COUNT WITH DIFFERENTIAL
Eos, Fluid: 7 %
Lymphs, Fluid: 59 %
Monocyte-Macrophage-Serous Fluid: 17 % — ABNORMAL LOW (ref 50–90)
Neutrophil Count, Fluid: 17 % (ref 0–25)
Total Nucleated Cell Count, Fluid: 198 cu mm (ref 0–1000)

## 2019-08-17 LAB — PROTEIN, PLEURAL OR PERITONEAL FLUID: Total protein, fluid: <3 g/dL

## 2019-08-17 LAB — GLUCOSE, PLEURAL OR PERITONEAL FLUID: Glucose, Fluid: 84 mg/dL

## 2019-08-17 LAB — LACTATE DEHYDROGENASE, PLEURAL OR PERITONEAL FLUID: LD, Fluid: 329 U/L — ABNORMAL HIGH (ref 3–23)

## 2019-08-17 LAB — MRSA PCR SCREENING: MRSA by PCR: NEGATIVE

## 2019-08-17 MED ORDER — RENA-VITE PO TABS
1.0000 | ORAL_TABLET | Freq: Every day | ORAL | Status: DC
  Administered 2019-08-17 – 2019-08-25 (×9): 1 via ORAL
  Filled 2019-08-17 (×9): qty 1

## 2019-08-17 MED ORDER — NEPRO/CARBSTEADY PO LIQD
237.0000 mL | Freq: Two times a day (BID) | ORAL | Status: DC
  Administered 2019-08-17 – 2019-08-26 (×13): 237 mL via ORAL

## 2019-08-17 MED ORDER — LIDOCAINE 5 % EX PTCH
1.0000 | MEDICATED_PATCH | CUTANEOUS | Status: DC
  Administered 2019-08-17 – 2019-08-25 (×8): 1 via TRANSDERMAL
  Filled 2019-08-17 (×9): qty 1

## 2019-08-17 MED ORDER — LIDOCAINE HCL (PF) 1 % IJ SOLN
INTRAMUSCULAR | Status: DC | PRN
  Administered 2019-08-17: 10 mL

## 2019-08-17 MED ORDER — SODIUM CHLORIDE 0.9 % IV SOLN
100.0000 mg | INTRAVENOUS | Status: AC
  Administered 2019-08-17 – 2019-08-20 (×4): 100 mg via INTRAVENOUS
  Filled 2019-08-17 (×4): qty 20

## 2019-08-17 MED ORDER — LIDOCAINE HCL 1 % IJ SOLN
INTRAMUSCULAR | Status: AC
  Filled 2019-08-17: qty 20

## 2019-08-17 MED ORDER — CHLORHEXIDINE GLUCONATE CLOTH 2 % EX PADS
6.0000 | MEDICATED_PAD | Freq: Every day | CUTANEOUS | Status: DC
  Administered 2019-08-18 – 2019-08-26 (×7): 6 via TOPICAL

## 2019-08-17 MED ORDER — SODIUM CHLORIDE 0.9 % IV SOLN
200.0000 mg | Freq: Once | INTRAVENOUS | Status: AC
  Administered 2019-08-17: 200 mg via INTRAVENOUS
  Filled 2019-08-17: qty 40

## 2019-08-17 NOTE — Progress Notes (Signed)
Lab attempted to collect blood without success.  Left arm is swollen as a result of fluid.  Right arm is restricted because of dialysis fistula.  IV team consulted.  IV team does not believe they will be successful in obtaining a new IV site because of excess fluid and restricted arm.

## 2019-08-17 NOTE — Progress Notes (Signed)
PROGRESS NOTE    Theodore Nguyen  [id number] DOB: [birth year] DOA: 08/16/2019 PCP: Eber Hong, MD    Brief Narrative:  73 year old male who is nursing home resident, nonambulatory for the last year, end-stage renal disease on hemodialysis, type 2 diabetes mellitus and hypertension.  Reports 2 days of generalized malaise, progressive weakness, no fevers, no chills, no significant dyspnea.  Today during hemodialysis he was hypotensive and hypoxic.  On his initial physical examination blood pressure 93/54, heart rate 82, respiratory rate 14, saturation 88% on room air.  Decreased breath sounds bilaterally, heart S1-S2 present rhythmic, abdomen soft, trace lower extremity edema.  Sodium 136, potassium 4.4, chloride 99, bicarb 27, glucose 75, BUN 24, creatinine 3.3, white count 3.5, hemoglobin 10.1, hematocrit 38.3, platelets 147.  SARS COVID-19 positive.  Chest radiograph with large left pleural effusion, small right pleural effusion, congestive hilar regions.  CT chest with left pleural effusion with left lower lobe and left upper lobe atelectasis.  Patient will be admitted to the hospital with the working diagnosis of acute hypoxic respiratory failure due to volume overload in the setting of end-stage renal disease and acute SARS COVID-19 infection.   Assessment & Plan:   Principal Problem:   COVID-19 virus infection Active Problems:   Volume overload   ESRD (end stage renal disease) ([place])   Essential hypertension   Type 2 diabetes mellitus with chronic kidney disease on chronic dialysis ([place])   Anemia due to chronic kidney disease   1. Acute hypoxic respiratory failure, due to left pleural effusion, volume overload and possible viral pneumonia due to COVID 19. Patient with improved symptoms after thoracentesis, 1000 ml pleural fluid removed. Oxygenation is 93% on room air. Further fluid management per HD and UF.   Patient is high risk for ARDS due to COVID 19, will continue  dexamethasone and remdesivir for now. Will follow inflammatory markers in am.   2. ESRD on HD. Improved dyspnea post thoracentesis. His K is 4,4 and serum bicarbonate at 27. Will continue to follow nephrology recommendations.   3. HTN. Continue blood pressure monitoring, holding on antihypertensive agents for now.   4. T2DM with dyslipidemia. Will continue glucose cover and monitoring with insulin sliding scale, patient is tolerating po well. Continue atorvastatin.   5. Anemia of chronic renal disease. Cell count stable, no indication for transfusion.   6. Coccyx pressure ulcer stage 2 present on admission. Will consult wound care for evaluation.   DVT prophylaxis: heparin   Code Status: full Family Communication: no family at the bedside  Disposition Plan/ discharge barriers: pending clinical improvement.   Body mass index is 28.55 kg/m. Malnutrition Type:      Malnutrition Characteristics:      Nutrition Interventions:     RN Pressure Injury Documentation: Pressure Injury 08/16/19 Coccyx Posterior Stage II -  Partial thickness loss of dermis presenting as a shallow open ulcer with a red, pink wound bed without slough. (Active)  08/16/19 2215  Location: Coccyx  Location Orientation: Posterior  Staging: Stage II -  Partial thickness loss of dermis presenting as a shallow open ulcer with a red, pink wound bed without slough.  Wound Description (Comments):   Present on Admission: Yes     Consultants:   Nephrology   Procedures:   Thoracentesis 08/17/19 1 Lt   Antimicrobials:       Subjective: Patient sp left thoracentesis with improvement of dyspnea, but not yet back to baseline, no nausea or vomiting, no chest pain.   Objective:  Vitals:   08/16/19 2115 08/16/19 2243 08/17/19 0403 08/17/19 0858  BP: 132/86 118/76 124/88 129/74  Pulse: 75 74 80 81  Resp: 12 16 14 16   Temp:  (!) 97.5 F (36.4 C) 97.6 F (36.4 C)   TempSrc:  Oral Axillary   SpO2:   100%  93%  Weight:      Height:        Intake/Output Summary (Last 24 hours) at 08/17/2019 1134 Last data filed at 08/17/2019 0300 Gross per 24 hour  Intake 250 ml  Output -  Net 250 ml   Filed Weights   08/16/19 1024  Weight: 95.5 kg    Examination:   General: Not in pain or dyspnea, deconditioned  Neurology: Awake and alert, non focal  E ENT: mild pallor, no icterus, oral mucosa moist Cardiovascular: No JVD. S1-S2 present, rhythmic, no gallops, rubs, or murmurs. No lower extremity edema. Pulmonary: positive breath sounds bilaterally,mild decreased breath sounds at the left base, no wheezing, rhonchi or rales. Gastrointestinal. Abdomen with no organomegaly, non tender, no rebound or guarding Skin. No rashes Musculoskeletal: no joint deformities     Data Reviewed: I have personally reviewed following labs and imaging studies  CBC: Recent Labs  Lab 08/16/19 1502  WBC 3.5*  NEUTROABS 3.0  HGB 10.1*  HCT 38.3*  MCV 100.0  PLT [id number]*   Basic Metabolic Panel: Recent Labs  Lab 08/16/19 1502  NA 136  K 4.4  CL 99  CO2 27  GLUCOSE 75  BUN 24*  CREATININE 3.33*  CALCIUM 8.2*   GFR: Estimated Creatinine Clearance: 24.1 mL/min (A) (by C-G formula based on SCr of 3.33 mg/dL (H)). Liver Function Tests: Recent Labs  Lab 08/16/19 1502  AST 12*  ALT 9  ALKPHOS 100  BILITOT 0.7  PROT 6.1*  ALBUMIN 2.6*   No results for input(s): LIPASE, AMYLASE in the last 168 hours. No results for input(s): AMMONIA in the last 168 hours. Coagulation Profile: No results for input(s): INR, PROTIME in the last 168 hours. Cardiac Enzymes: No results for input(s): CKTOTAL, CKMB, CKMBINDEX, TROPONINI in the last 168 hours. BNP (last 3 results) No results for input(s): PROBNP in the last 8760 hours. HbA1C: No results for input(s): HGBA1C in the last 72 hours. CBG: No results for input(s): GLUCAP in the last 168 hours. Lipid Profile: No results for input(s): CHOL, HDL, LDLCALC, TRIG,  CHOLHDL, LDLDIRECT in the last 72 hours. Thyroid Function Tests: No results for input(s): TSH, T4TOTAL, FREET4, T3FREE, THYROIDAB in the last 72 hours. Anemia Panel: No results for input(s): VITAMINB12, FOLATE, FERRITIN, TIBC, IRON, RETICCTPCT in the last 72 hours.    Radiology Studies: I have reviewed all of the imaging during this hospital visit personally     Scheduled Meds: . aspirin EC  81 mg Oral Daily  . atorvastatin  40 mg Oral Daily  . dexamethasone (DECADRON) injection  6 mg Intravenous Q24H  . feeding supplement (ENSURE ENLIVE)  237 mL Oral BID BM  . gabapentin  100 mg Oral TID  . heparin  5,000 Units Subcutaneous Q8H  . lidocaine      . pantoprazole  40 mg Oral Daily  . sertraline  25 mg Oral Daily   Continuous Infusions: . remdesivir 100 mg in NS 250 mL       LOS: 1 day        Susie Pousson Gerome Apley, MD

## 2019-08-17 NOTE — Progress Notes (Signed)
Initial Nutrition Assessment  INTERVENTION:   -Nepro Shake po BID, each supplement provides 425 kcal and 19 grams protein  NUTRITION DIAGNOSIS:   Increased nutrient needs related to acute illness, chronic illness as evidenced by estimated needs.  GOAL:   Patient will meet greater than or equal to 90% of their needs  MONITOR:   PO intake, Supplement acceptance, Labs, Weight trends, I & O's  REASON FOR ASSESSMENT:   Malnutrition Screening Tool    ASSESSMENT:   73 y.o. male with medical history significant of type 2 diabetes mellitus, end-stage renal disease on hemodialysis, (Tuesday Thursday, Saturday), hypertension, dyslipidemia and iron deficiency anemia. 9/8: COVID+  **RD working remotely**  Patient from facility, follows a renal diet at facility. Pt with ESRD on HD, pt missed last HD given hypotension. Typical schedule TTS.    Patient is currently consuming 100% of meals. Pt has been ordered Ensure supplements, will switch to Nepro supplements.  No weight records available PTA since 2018.    Medications reviewed. Labs reviewed:  GFR: 20  NUTRITION - FOCUSED PHYSICAL EXAM:  Unable to perform -working remotely.  Diet Order:   Diet Order            Diet Heart Room service appropriate? Yes; Fluid consistency: Thin  Diet effective now              EDUCATION NEEDS:   No education needs have been identified at this time  Skin:  Skin Assessment: Skin Integrity Issues: Skin Integrity Issues:: Stage II Stage II: coccyx  Last BM:  9/8  Height:   Ht Readings from Last 1 Encounters:  08/16/19 6' (1.829 m)    Weight:   Wt Readings from Last 1 Encounters:  08/16/19 95.5 kg    Ideal Body Weight:  80.9 kg  BMI:  Body mass index is 28.55 kg/m.  Estimated Nutritional Needs:   Kcal:  2500-2700  Protein:  110-120g  Fluid:  UOP + 1L   Clayton Bibles, MS, RD, LDN Inpatient Clinical Dietitian Pager: [phone] After Hours Pager: [phone]

## 2019-08-17 NOTE — Procedures (Signed)
PROCEDURE SUMMARY:  Successful US guided left thoracentesis. Yielded 1 L of bloody pleural fluid. Pt tolerated procedure well. No immediate complications.  Specimen was sent for labs. CXR ordered.  EBL < 5 mL  Ascencion Dike PA-C 08/17/2019 11:51 AM

## 2019-08-17 NOTE — Consult Note (Signed)
[facility] Nurse wound consult note Bedside RN Asencion Partridge assisted with remote consult for this Covid positive patient.  Reason for Consult: Chronic edema, managed with Unna boots.  Stage 2 sacral wound, present on admission.  Will implement modified light compression while here and in bed.   Wound type: Compression Pressure Injury POA: Yes Measurement: Legs are intact Stage 2 to sacrum 1 cm x 1 cm x 0.1 cm hyperpigmented Wound [id number] to sacrum Drainage (amount, consistency, odor) moderate weeping Periwound: intact Dressing procedure/placement/frequency: Cleanse bilateral legs with soap and water and pat dry.  Wrap both legs with kerlix from below toes to below knee.  Secure with self adherent Coban.  Change twice weekly.  Monday and Thursday. Bedside RN to perform.  Sacral foam dressing changed every three days.  Will not follow at this time.  Please re-consult if needed.  Domenic Moras MSN, RN, FNP-BC CWON Wound, Ostomy, Continence Nurse Pager [phone]

## 2019-08-17 NOTE — Progress Notes (Signed)
Around 11AM was contacted by IR that thoracentesis would be performed at bedside. Met with  Theodore Nguyen and Theodore Nguyen at bedside.   Consents had been obtained.   A time out was performed to verify appropriate patient and procedure.   Patient turned on right side, area prepped, lidocaine administered.  A scalpel was used  to open skin for entry of needle.   About 1L of serosanguinous drainage removed.   Shortly thereafter CXR ordered.  SPO2 100% post procedure.

## 2019-08-17 NOTE — Progress Notes (Signed)
Renal Navigator spoke with patient's OP HD clinic Administrator/Joy who states she has been contacted by patient's SNF/[facility] of his positive COVID status.  Due to positive COVID status, patient will now need to receive OP HD treatment at [place] positive isolation shift at discharge, which is at the [facility] clinic in [place] at [place]. Per Joy at [facility], patient has already been transferred to this clinic and has a TTS chair time of 11:30am. He needs to arrive at 11:15am. Renal Navigator notified CSW/L. [place] of above.   Alphonzo Cruise, [place] Renal Navigator [phone]

## 2019-08-17 NOTE — Consult Note (Signed)
Reason for Consult: Care of end-stage renal disease Referring Physician: Sander Radon MD [facility])  HPI:  73 year old African-American man with past medical history significant for hypertension, diabetes mellitus, dyslipidemia and end-stage renal disease on hemodialysis for the past 8 years at the [facility] unit in [place], [place].  He presented yesterday to the [facility] emergency room from his dialysis unit where he was found to be dyspneic/hypoxic (oxygen saturation 83% on room air) and hypotensive.  He reports a preceding history of generalized weakness/malaise without associated cough, fever or chills for 2 days prior to his presentation.  He reports that several residents at the facility where he stays have recently been diagnosed with COVID infection.  Testing in the emergency room revealed that he also has COVID-19 infection.  Chest imaging showed large left pleural effusion with atelectasis of the left lower lobe and left upper lobe with a 4 cm ascending thoracic aortic aneurysm.  This morning, he underwent thoracentesis by IR yielding 1 L of bloody pleural fluid that has been sent for analysis-early reports indicate transudative.  When seen today, he denies any nausea, vomiting or diarrhea and has eaten his lunch without any problems.  He has some shortness of breath which is seemingly improved with ongoing oxygen supplementation via nasal cannula.  He denies any chest pain.  Dialysis prescription: Outpatient dialysis unit contacted for records.  Past Medical History:  Diagnosis Date  . Diabetes mellitus without complication ([place])   . High cholesterol   . Hyperlipidemia   . Hypertension   . Iron deficiency anemia   . Renal disorder    dialysis    Past Surgical History:  Procedure Laterality Date  . INSERTION OF DIALYSIS CATHETER    . IR THORACENTESIS ASP PLEURAL SPACE W/IMG GUIDE  08/17/2019  . left hand surgery    . right knee repair      Family History  Problem Relation Age of  Onset  . Cancer Mother     Social History:  reports that he has never smoked. He has never used smokeless tobacco. He reports that he does not drink alcohol or use drugs.  Allergies: No Known Allergies  Medications:  Scheduled: . aspirin EC  81 mg Oral Daily  . atorvastatin  40 mg Oral Daily  . dexamethasone (DECADRON) injection  6 mg Intravenous Q24H  . feeding supplement (NEPRO CARB STEADY)  237 mL Oral BID BM  . gabapentin  100 mg Oral TID  . heparin  5,000 Units Subcutaneous Q8H  . lidocaine      . pantoprazole  40 mg Oral Daily  . sertraline  25 mg Oral Daily    BMP Latest Ref Rng & Units 08/16/2019  Glucose 70 - 99 mg/dL 75  BUN 8 - 23 mg/dL 24(H)  Creatinine 0.61 - 1.24 mg/dL 3.33(H)  Sodium 135 - 145 mmol/L 136  Potassium 3.5 - 5.1 mmol/L 4.4  Chloride 98 - 111 mmol/L 99  CO2 22 - 32 mmol/L 27  Calcium 8.9 - 10.3 mg/dL 8.2(L)   CBC Latest Ref Rng & Units 08/16/2019  WBC 4.0 - 10.5 K/uL 3.5(L)  Hemoglobin 13.0 - 17.0 g/dL 10.1(L)  Hematocrit 39.0 - 52.0 % 38.3(L)  Platelets 150 - 400 K/uL 147(L)     Ct Chest Wo Contrast  Result Date: 08/16/2019 CLINICAL DATA:  Chest pain, shortness of breath. EXAM: CT CHEST WITHOUT CONTRAST TECHNIQUE: Multidetector CT imaging of the chest was performed following the standard protocol without IV contrast. COMPARISON:  Radiographs  of same day. FINDINGS: Cardiovascular: Atherosclerosis of thoracic aorta is noted. 4 cm ascending thoracic aortic aneurysm is noted. Dissection could not be evaluated for due to lack of intravenous contrast. Mild cardiomegaly is noted. No pericardial effusion is noted. Extensive coronary artery calcifications are noted. Mediastinum/Nodes: No enlarged mediastinal or axillary lymph nodes. Thyroid gland, trachea, and esophagus demonstrate no significant findings. Lungs/Pleura: No pneumothorax is noted. Large left pleural effusion is noted with complete atelectasis of the left lower lobe and significant atelectasis of  the left upper lobe. Mild right pleural effusion is noted with adjacent atelectasis of the right lower lobe. Upper Abdomen: No acute abnormality. Musculoskeletal: Increased sclerosis of visualized skeleton is noted consistent with renal osteodystrophy. IMPRESSION: Large left pleural effusion is noted with complete atelectasis of the left lower lobe and significant atelectasis of the left upper lobe. Small right pleural effusion is noted with adjacent subsegmental atelectasis of the right lower lobe. 4 cm ascending thoracic aortic aneurysm. Recommend annual imaging followup by CTA or MRA. This recommendation follows 2010 ACCF/AHA/AATS/ACR/ASA/SCA/SCAI/SIR/STS/SVM Guidelines for the Diagnosis and Management of Patients with Thoracic Aortic Disease. Circulation. 2010; [id number]. Aortic aneurysm NOS (ICD10-I71.9). Extensive coronary artery calcifications are noted concerning for coronary artery disease. Aortic Atherosclerosis (ICD10-I70.0). Electronically Signed   By: Marijo Conception M.D.   On: 08/16/2019 15:47   Dg Chest Port 1 View  Result Date: 08/17/2019 CLINICAL DATA:  Status post left thoracentesis EXAM: PORTABLE CHEST 1 VIEW COMPARISON:  08/16/2019 FINDINGS: Status post interval left thoracentesis with reduction in volume of a large left pleural effusion, now moderate, with improved aeration of the left lung. No significant pneumothorax. Small, layering right pleural effusion. Cardiomegaly. IMPRESSION: Status post interval left thoracentesis with reduction in volume of a large left pleural effusion, now moderate, with improved aeration of the left lung. No significant pneumothorax. Electronically Signed   By: Eddie Candle M.D.   On: 08/17/2019 12:19   Dg Chest Portable 1 View  Result Date: 08/16/2019 CLINICAL DATA:  Shortness of breath EXAM: PORTABLE CHEST 1 VIEW COMPARISON:  02/20/2017 FINDINGS: Cardiac shadow is enlarged but stable. Aortic calcifications are noted. Large left-sided pleural effusion  is noted with likely underlying atelectasis/infiltrate. No bony abnormality is seen. IMPRESSION: Large left-sided pleural effusion. Likely underlying atelectasis/infiltrate is present. Electronically Signed   By: Inez Catalina M.D.   On: 08/16/2019 11:14   Ir Thoracentesis Asp Pleural Space W/img Guide  Result Date: 08/17/2019 INDICATION: Shortness of breath, left-sided pleural effusion in the presence of SARS COVID-19 infection. EXAM: ULTRASOUND GUIDED LEFT THORACENTESIS MEDICATIONS: None. COMPLICATIONS: None immediate. PROCEDURE: An ultrasound guided thoracentesis was thoroughly discussed with the patient and questions answered. The benefits, risks, alternatives and complications were also discussed. The patient understands and wishes to proceed with the procedure. Written consent was obtained. Ultrasound was performed to localize and mark an adequate pocket of fluid in the left chest. The area was then prepped and draped in the normal sterile fashion. 1% Lidocaine was used for local anesthesia. Under ultrasound guidance a 6 Fr Safe-T-Centesis catheter was introduced. Thoracentesis was performed. The catheter was removed and a dressing applied. FINDINGS: A total of approximately 1 L of bloody pleural fluid was removed. Samples were sent to the laboratory as requested by the clinical team. IMPRESSION: Successful ultrasound guided left thoracentesis yielding 1 L of pleural fluid. Read by: Ascencion Dike PA-C Electronically Signed   By: Lucrezia Europe M.D.   On: 08/17/2019 11:51    Review of Systems  Constitutional:  Positive for malaise/fatigue. Negative for chills and fever.  HENT: Negative.   Eyes: Negative.   Respiratory: Positive for shortness of breath. Negative for cough, hemoptysis and sputum production.   Cardiovascular: Negative.   Gastrointestinal: Negative.   Genitourinary: Negative.   Musculoskeletal: Negative.   Skin: Negative.   Neurological: Positive for dizziness. Negative for sensory  change, speech change and headaches.   Blood pressure 129/74, pulse 81, temperature 97.6 F (36.4 C), temperature source Axillary, resp. rate 16, height 6' (1.829 m), weight 95.5 kg, SpO2 93 %. Physical Exam  Nursing note and vitals reviewed. Constitutional: He is oriented to person, place, and time. He appears well-developed and well-nourished. No distress.  HENT:  Head: Normocephalic and atraumatic.  Mouth/Throat: Oropharynx is clear and moist.  Eyes: Pupils are equal, round, and reactive to light. Conjunctivae and EOM are normal.  Neck: Normal range of motion. Neck supple. No JVD present.  Cardiovascular: Normal rate, regular rhythm and normal heart sounds.  No murmur heard. Respiratory: Effort normal. He has no wheezes. He has rales.  Fine rales with some expiratory rhonchi  GI: Soft. Bowel sounds are normal. There is no abdominal tenderness. There is no rebound.  Musculoskeletal: Normal range of motion.     Comments: Right brachiocephalic fistula-aneurysmal/pulsatile. Both lower extremity in wraps with some adjacent edema noted.  Neurological: He is alert and oriented to person, place, and time.  Skin: Skin is warm and dry.  Psychiatric: He has a normal mood and affect.    Assessment/Plan: 1.  Acute hypoxic respiratory failure: Appears to be multifactorial from presence of a significant pleural effusion along with COVID-19 infection in a patient with end-stage renal disease.  He reports some symptomatic improvement following thoracentesis and based on physical exam and available labs from today, will defer hemodialysis until tomorrow. 2.  End-stage renal disease: Usually on a Tuesday/Thursday/Saturday dialysis schedule with incomplete dialysis delivery yesterday after found to be hypotensive/hypoxic.  Undertake dialysis tomorrow with efforts at volume unloading.  It is plausible that with his pleural effusion/bilateral pedal edema, he is under dialyzed. 3.  Hypotension: Appears to  have resolved with better blood pressures noted since admission/thoracentesis. 4.  Anemia of chronic kidney disease: Borderline hemoglobin and hematocrit, no overt loss and will restart ESA. 5.  Secondary hyperparathyroidism: Resume phosphorus binder along with vitamin D receptor analog/Sensipar for PTH suppression (to be determined by outpatient records). 6.  Nutrition: Resume renal diet with oral protein supplementation and renal multivitamin.  Karess Harner K. 08/17/2019, 3:29 PM

## 2019-08-18 LAB — CBC WITH DIFFERENTIAL/PLATELET
Abs Immature Granulocytes: 0.05 10*3/uL (ref 0.00–0.07)
Basophils Absolute: 0 10*3/uL (ref 0.0–0.1)
Basophils Relative: 0 %
Eosinophils Absolute: 0 10*3/uL (ref 0.0–0.5)
Eosinophils Relative: 0 %
HCT: 36.9 % — ABNORMAL LOW (ref 39.0–52.0)
Hemoglobin: 10.3 g/dL — ABNORMAL LOW (ref 13.0–17.0)
Immature Granulocytes: 1 %
Lymphocytes Relative: 2 %
Lymphs Abs: 0.2 10*3/uL — ABNORMAL LOW (ref 0.7–4.0)
MCH: 26.3 pg (ref 26.0–34.0)
MCHC: 27.9 g/dL — ABNORMAL LOW (ref 30.0–36.0)
MCV: 94.4 fL (ref 80.0–100.0)
Monocytes Absolute: 0.3 10*3/uL (ref 0.1–1.0)
Monocytes Relative: 4 %
Neutro Abs: 8.6 10*3/uL — ABNORMAL HIGH (ref 1.7–7.7)
Neutrophils Relative %: 93 %
Platelets: 155 10*3/uL (ref 150–400)
RBC: 3.91 MIL/uL — ABNORMAL LOW (ref 4.22–5.81)
RDW: 17 % — ABNORMAL HIGH (ref 11.5–15.5)
WBC: 9.2 10*3/uL (ref 4.0–10.5)
nRBC: 0 % (ref 0.0–0.2)

## 2019-08-18 LAB — RENAL FUNCTION PANEL
Albumin: 2.1 g/dL — ABNORMAL LOW (ref 3.5–5.0)
Anion gap: 9 (ref 5–15)
BUN: 45 mg/dL — ABNORMAL HIGH (ref 8–23)
CO2: 28 mmol/L (ref 22–32)
Calcium: 8.3 mg/dL — ABNORMAL LOW (ref 8.9–10.3)
Chloride: 100 mmol/L (ref 98–111)
Creatinine, Ser: 4.93 mg/dL — ABNORMAL HIGH (ref 0.61–1.24)
GFR calc Af Amer: 13 mL/min — ABNORMAL LOW (ref >60)
GFR calc non Af Amer: 11 mL/min — ABNORMAL LOW (ref >60)
Glucose, Bld: 84 mg/dL (ref 70–99)
Phosphorus: 4.8 mg/dL — ABNORMAL HIGH (ref 2.5–4.6)
Potassium: 4.9 mmol/L (ref 3.5–5.1)
Sodium: 137 mmol/L (ref 135–145)

## 2019-08-18 LAB — GRAM STAIN

## 2019-08-18 LAB — D-DIMER, QUANTITATIVE: D-Dimer, Quant: 2.17 ug/mL-FEU — ABNORMAL HIGH (ref 0.00–0.50)

## 2019-08-18 LAB — PH, BODY FLUID: pH, Body Fluid: 7.5

## 2019-08-18 LAB — C-REACTIVE PROTEIN: CRP: 8.3 mg/dL — ABNORMAL HIGH (ref <1.0)

## 2019-08-18 LAB — FERRITIN: Ferritin: 836 ng/mL — ABNORMAL HIGH (ref 24–336)

## 2019-08-18 NOTE — Plan of Care (Signed)
  Problem: Education: Goal: Knowledge of General Education information will improve Description: Including pain rating scale, medication(s)/side effects and non-pharmacologic comfort measures Outcome: Progressing   Problem: Clinical Measurements: Goal: Respiratory complications will improve Outcome: Progressing   Problem: Nutrition: Goal: Adequate nutrition will be maintained Outcome: Progressing   Problem: Coping: Goal: Level of anxiety will decrease Outcome: Progressing   Problem: Pain Managment: Goal: General experience of comfort will improve Outcome: Progressing   

## 2019-08-18 NOTE — Progress Notes (Signed)
I spoke over the phone with the patient's daughter about patient's condition, plan of care and all questions were addressed. 

## 2019-08-18 NOTE — Progress Notes (Signed)
PROGRESS NOTE    Theodore Nguyen  [id number] DOB: 08/30/1946 DOA: 08/16/2019 PCP: Eber Hong, MD    Brief Narrative:  73 year old male who is nursing home resident, nonambulatory for the last year, end-stage renal disease on hemodialysis, type 2 diabetes mellitus and hypertension. Reports 2 days of generalized malaise, progressive weakness, no fevers,no chills,no significant dyspnea. Today during hemodialysis he was hypotensive and hypoxic. On his initial physical examination blood pressure 93/54,heart rate 82, respiratory rate 14, saturation 88% on room air.Decreased breath sounds bilaterally, heart S1-S2 present rhythmic, abdomen soft, trace lower extremity edema. Sodium 136, potassium 4.4, chloride 99, bicarb 27, glucose 75, BUN 24, creatinine 3.3, white count 3.5, hemoglobin 10.1, hematocrit 38.3, platelets 147. SARS COVID-19 positive.Chest radiograph with large left pleural effusion, small right pleural effusion, congestive hilar regions. CT chest with left pleural effusion with left lower lobe andleft upper lobe atelectasis.  Patient will be admitted to the hospital with the working diagnosis of acute hypoxic respiratory failure due to volume overload in the setting of end-stage renal disease and acute SARS COVID-19 infection.   Assessment & Plan:   Principal Problem:   COVID-19 virus infection Active Problems:   Volume overload   ESRD (end stage renal disease) (HCC)   Essential hypertension   Type 2 diabetes mellitus with chronic kidney disease on chronic dialysis ([place])   Anemia due to chronic kidney disease   Pressure injury of skin    1. Acute hypoxic respiratory failure, due to left pleural effusion, volume overload and possible viral pneumonia due to COVID 19. sp thoracentesis, 1000 ml pleural fluid removed. Transudate per Light's criteria per protein ration. Will check serum LDH in am. Continue oxygenation will at 97% on 1 LPM.   Patient with elevated  ferritin and CRP. Will plan to complete 5 doses of Remdesivir and 10 days of dexamethasone for now.     2. ESRD on HD. Improved volume status, will continue HD per nephrology recommendations.  3. HTN. Blood pressure 107/74, off antihypertensive agents.  4. T2DM with dyslipidemia. Fasting glucose is 75 today, will continue glucose cover and monitoring with insulin sliding scale.  5. Anemia of chronic renal disease. Hgb at 10 and Hct at 36,.   6. Coccyx pressure ulcer stage 2 present on admission. Local wound care.  DVT prophylaxis: heparin   Code Status: full Family Communication: no family at the bedside  Disposition Plan/ discharge barriers: will plan to complete 5 days of Remdesivir.    Body mass index is 28.55 kg/m. Malnutrition Type:  Nutrition Problem: Increased nutrient needs Etiology: acute illness, chronic illness   Malnutrition Characteristics:  Signs/Symptoms: estimated needs   Nutrition Interventions:  Interventions: Nepro shake  RN Pressure Injury Documentation: Pressure Injury 08/16/19 Coccyx Posterior Stage II -  Partial thickness loss of dermis presenting as a shallow open ulcer with a red, pink wound bed without slough. (Active)  08/16/19 2215  Location: Coccyx  Location Orientation: Posterior  Staging: Stage II -  Partial thickness loss of dermis presenting as a shallow open ulcer with a red, pink wound bed without slough.  Wound Description (Comments):   Present on Admission: Yes     Consultants:   Nephrology   Procedures:     Antimicrobials:   Remdesivir,     Subjective: Patient with improvement in dyspnea but not back to baseline, no nausea or vomiting, no cough. No chest pain.   Objective: Vitals:   08/17/19 2346 08/18/19 0357 08/18/19 0734 08/18/19 0800  BP: 112/64 115/65  110/69   Pulse: 87 87 87   Resp: 16 (!) 21 15   Temp: 97.7 F (36.5 C) 97.9 F (36.6 C)  98 F (36.7 C)  TempSrc: Oral Oral  Oral  SpO2: 97% 97%  98%   Weight:      Height:       No intake or output data in the 24 hours ending 08/18/19 1213 Filed Weights   08/16/19 1024  Weight: 95.5 kg    Examination:   General: Not in pain or dyspnea, deconditioned  Neurology: Awake and alert, non focal  E ENT: mild pallor, no icterus, oral mucosa moist Cardiovascular: No JVD. S1-S2 present, rhythmic, no gallops, rubs, or murmurs. No lower extremity edema. Pulmonary: positive breath sounds bilaterally, no wheezing, rhonchi or rales. Mild decreased breath sounds at the left base.  Gastrointestinal. Abdomen with no organomegaly, non tender, no rebound or guarding Skin. No rashes Musculoskeletal: no joint deformities     Data Reviewed: I have personally reviewed following labs and imaging studies  CBC: Recent Labs  Lab 08/16/19 1502 08/18/19 0426  WBC 3.5* 9.2  NEUTROABS 3.0 8.6*  HGB 10.1* 10.3*  HCT 38.3* 36.9*  MCV 100.0 94.4  PLT 147* 99991111   Basic Metabolic Panel: Recent Labs  Lab 08/16/19 1502  NA 136  K 4.4  CL 99  CO2 27  GLUCOSE 75  BUN 24*  CREATININE 3.33*  CALCIUM 8.2*   GFR: Estimated Creatinine Clearance: 24.1 mL/min (A) (by C-G formula based on SCr of 3.33 mg/dL (H)). Liver Function Tests: Recent Labs  Lab 08/16/19 1502  AST 12*  ALT 9  ALKPHOS 100  BILITOT 0.7  PROT 6.1*  ALBUMIN 2.6*   No results for input(s): LIPASE, AMYLASE in the last 168 hours. No results for input(s): AMMONIA in the last 168 hours. Coagulation Profile: No results for input(s): INR, PROTIME in the last 168 hours. Cardiac Enzymes: No results for input(s): CKTOTAL, CKMB, CKMBINDEX, TROPONINI in the last 168 hours. BNP (last 3 results) No results for input(s): PROBNP in the last 8760 hours. HbA1C: No results for input(s): HGBA1C in the last 72 hours. CBG: No results for input(s): GLUCAP in the last 168 hours. Lipid Profile: No results for input(s): CHOL, HDL, LDLCALC, TRIG, CHOLHDL, LDLDIRECT in the last 72 hours.  Thyroid Function Tests: No results for input(s): TSH, T4TOTAL, FREET4, T3FREE, THYROIDAB in the last 72 hours. Anemia Panel: Recent Labs    08/18/19 0426  FERRITIN 836*      Radiology Studies: I have reviewed all of the imaging during this hospital visit personally     Scheduled Meds: . aspirin EC  81 mg Oral Daily  . atorvastatin  40 mg Oral Daily  . Chlorhexidine Gluconate Cloth  6 each Topical Q0600  . dexamethasone (DECADRON) injection  6 mg Intravenous Q24H  . feeding supplement (NEPRO CARB STEADY)  237 mL Oral BID BM  . gabapentin  100 mg Oral TID  . heparin  5,000 Units Subcutaneous Q8H  . lidocaine  1 patch Transdermal Q24H  . multivitamin  1 tablet Oral QHS  . pantoprazole  40 mg Oral Daily  . sertraline  25 mg Oral Daily   Continuous Infusions: . remdesivir 100 mg in NS 250 mL 100 mg (08/17/19 2201)     LOS: 2 days        Mauricio Gerome Apley, MD

## 2019-08-18 NOTE — Progress Notes (Signed)
Patient ID: Theodore Nguyen, male   DOB: 12-17-1945, 73 y.o.   MRN: [id number]  [facility] Progress Note   Assessment/ Plan:   1.  Acute hypoxic respiratory failure: Appears to be multifactorial from presence of a significant pleural effusion along with COVID-19 infection in a patient with end-stage renal disease.  He reports notable improvement of shortness of breath after thoracentesis and limited oxygen requirements noted overnight. 2.  End-stage renal disease: Usually on a Tuesday/Thursday/Saturday dialysis schedule with incomplete dialysis delivery on Tuesday after found to be hypotensive/hypoxic.  Will get hemodialysis today; appreciate input from renal navigator in tracing out his disposition dialysis unit for COVID positive status. 3.  Hypotension: Appears to have resolved with better blood pressures noted since admission/thoracentesis. 4.  Anemia of chronic kidney disease: Borderline hemoglobin and hematocrit, no overt loss and restarted ESA. 5.  Secondary hyperparathyroidism: Resume phosphorus binder along with vitamin D receptor analog/Sensipar for PTH suppression (awaiting outpatient records). 6.  Nutrition: Resume renal diet with oral protein supplementation and renal multivitamin.  Subjective:   Without acute events overnight, breathing better after thoracentesis yesterday.   Objective:   BP 110/69 (BP Location: Left Arm)   Pulse 87   Temp 97.9 F (36.6 C) (Oral)   Resp 15   Ht 6' (1.829 m)   Wt 95.5 kg   SpO2 98%   BMI 28.55 kg/m   Physical Exam: Gen: Comfortably resting in bed CVS: Pulse regular rhythm, normal rate, S1 and S2 normal Resp: Anteriorly clear to auscultation, no rales/rhonchi Abd: Soft, flat, nontender Ext: Bilateral lower extremities in wraps with some edema adjacent to wrap  Labs: BMET Recent Labs  Lab 08/16/19 1502  NA 136  K 4.4  CL 99  CO2 27  GLUCOSE 75  BUN 24*  CREATININE 3.33*  CALCIUM 8.2*   CBC Recent Labs  Lab  08/16/19 1502 08/18/19 0426  WBC 3.5* 9.2  NEUTROABS 3.0 8.6*  HGB 10.1* 10.3*  HCT 38.3* 36.9*  MCV 100.0 94.4  PLT 147* 155   Medications:    . aspirin EC  81 mg Oral Daily  . atorvastatin  40 mg Oral Daily  . Chlorhexidine Gluconate Cloth  6 each Topical Q0600  . dexamethasone (DECADRON) injection  6 mg Intravenous Q24H  . feeding supplement (NEPRO CARB STEADY)  237 mL Oral BID BM  . gabapentin  100 mg Oral TID  . heparin  5,000 Units Subcutaneous Q8H  . lidocaine  1 patch Transdermal Q24H  . multivitamin  1 tablet Oral QHS  . pantoprazole  40 mg Oral Daily  . sertraline  25 mg Oral Daily   [name], MD 08/18/2019, 8:27 AM

## 2019-08-19 DIAGNOSIS — L89302 Pressure ulcer of unspecified buttock, stage 2: Secondary | ICD-10-CM

## 2019-08-19 LAB — C-REACTIVE PROTEIN: CRP: 12.3 mg/dL — ABNORMAL HIGH (ref <1.0)

## 2019-08-19 LAB — COMPREHENSIVE METABOLIC PANEL
ALT: 10 U/L (ref 0–44)
AST: 13 U/L — ABNORMAL LOW (ref 15–41)
Albumin: 2.3 g/dL — ABNORMAL LOW (ref 3.5–5.0)
Alkaline Phosphatase: 84 U/L (ref 38–126)
Anion gap: 11 (ref 5–15)
BUN: 33 mg/dL — ABNORMAL HIGH (ref 8–23)
CO2: 27 mmol/L (ref 22–32)
Calcium: 8.2 mg/dL — ABNORMAL LOW (ref 8.9–10.3)
Chloride: 100 mmol/L (ref 98–111)
Creatinine, Ser: 4 mg/dL — ABNORMAL HIGH (ref 0.61–1.24)
GFR calc Af Amer: 16 mL/min — ABNORMAL LOW (ref >60)
GFR calc non Af Amer: 14 mL/min — ABNORMAL LOW (ref >60)
Glucose, Bld: 140 mg/dL — ABNORMAL HIGH (ref 70–99)
Potassium: 4.5 mmol/L (ref 3.5–5.1)
Sodium: 138 mmol/L (ref 135–145)
Total Bilirubin: 0.6 mg/dL (ref 0.3–1.2)
Total Protein: 5.8 g/dL — ABNORMAL LOW (ref 6.5–8.1)

## 2019-08-19 LAB — FERRITIN: Ferritin: 785 ng/mL — ABNORMAL HIGH (ref 24–336)

## 2019-08-19 LAB — D-DIMER, QUANTITATIVE: D-Dimer, Quant: 3.13 ug/mL-FEU — ABNORMAL HIGH (ref 0.00–0.50)

## 2019-08-19 LAB — PATHOLOGIST SMEAR REVIEW

## 2019-08-19 NOTE — Progress Notes (Signed)
PROGRESS NOTE    Theodore Nguyen  [id number] DOB: 11/25/1946 DOA: 08/16/2019 PCP: Eber Hong, MD    Brief Narrative:  73 year old male who is nursing home resident, nonambulatory for the last year, end-stage renal disease on hemodialysis, type 2 diabetes mellitus and hypertension. Reports 2 days of generalized malaise, progressive weakness, no fevers,no chills,no significant dyspnea. Today during hemodialysis he was hypotensive and hypoxic. On his initial physical examination blood pressure 93/54,heart rate 82, respiratory rate 14, saturation 88% on room air.Decreased breath sounds bilaterally, heart S1-S2 present rhythmic, abdomen soft, trace lower extremity edema. Sodium 136, potassium 4.4, chloride 99, bicarb 27, glucose 75, BUN 24, creatinine 3.3, white count 3.5, hemoglobin 10.1, hematocrit 38.3, platelets 147. SARS COVID-19 positive.Chest radiograph with large left pleural effusion, small right pleural effusion, congestive hilar regions. CT chest with left pleural effusion with left lower lobe andleft upper lobe atelectasis.  Patient will be admitted to the hospital with the working diagnosis of acute hypoxic respiratory failure due to volume overload in the setting of end-stage renal disease and acute SARS COVID-19 infection.   Assessment & Plan:   Principal Problem:   COVID-19 virus infection Active Problems:   Volume overload   ESRD (end stage renal disease) (HCC)   Essential hypertension   Type 2 diabetes mellitus with chronic kidney disease on chronic dialysis ([place])   Anemia due to chronic kidney disease   Pressure injury of skin    1. Acute hypoxic respiratory failure, due to left pleural effusion, volume overload and possible viral pneumonia due to COVID 19. sp thoracentesis, 1000 ml pleural fluid removed. Transudate per Light's criteria. Now patient is on room air and oxygenation is 92% on room air. Dyspnea continue to improve. Patient is tolerating well  ultrafiltration for volume management.   He will complete 5 day course of Remdesivir on 08/21/19. Will continue for a total of 10 days of dexamethasone. Patient is non ambulatory.      2. ESRD on HD. Tolerating well HD, improved volume satus.   3. HTN. Continue to hold on blood pressure medications, today's blood pressure 137/81.   4. T2DM with dyslipidemia. Fasting glucose is 140 today, patient is tolerating po well, continue insulin sliding scale for glucose cover and monitoring.   5. Anemia of chronic renal disease. Hgb and Hct have been stable, will hold on CBC in am for now.   6. Coccyx pressure ulcer stage 2 present on admission. Continue with local wound care.  DVT prophylaxis:heparin Code Status:full Family Communication:no family at the bedside Disposition Plan/ discharge barriers:will plan to complete 5 days of Remdesivir.     Body mass index is 28.91 kg/m. Malnutrition Type:  Nutrition Problem: Increased nutrient needs Etiology: acute illness, chronic illness   Malnutrition Characteristics:  Signs/Symptoms: estimated needs   Nutrition Interventions:  Interventions: Nepro shake  RN Pressure Injury Documentation: Pressure Injury 08/16/19 Coccyx Posterior Stage II -  Partial thickness loss of dermis presenting as a shallow open ulcer with a red, pink wound bed without slough. (Active)  08/16/19 2215  Location: Coccyx  Location Orientation: Posterior  Staging: Stage II -  Partial thickness loss of dermis presenting as a shallow open ulcer with a red, pink wound bed without slough.  Wound Description (Comments):   Present on Admission: Yes     Consultants:   Nephrology   IR   Procedures:   Left US guided thoracentesis.   Antimicrobials:       Subjective: Patient with intermittent cough, dyspnea continue to  improve, no nausea or vomiting, no chest pain. Continue to be very weak and deconditioned.   Objective: Vitals:   08/19/19  0300 08/19/19 0330 08/19/19 0400 08/19/19 0430  BP: 110/71 117/74 130/86 122/63  Pulse: 92 93 93 92  Resp: 18 20 (!) 23 18  Temp:    97.8 F (36.6 C)  TempSrc:    Oral  SpO2: 100% 99% 95% 92%  Weight:    96.7 kg  Height:        Intake/Output Summary (Last 24 hours) at 08/19/2019 0837 Last data filed at 08/19/2019 0531 Gross per 24 hour  Intake 1691 ml  Output 2000 ml  Net -309 ml   Filed Weights   08/16/19 1024 08/19/19 0115 08/19/19 0430  Weight: 95.5 kg 98.7 kg 96.7 kg    Examination:   General: Not in pain or dyspnea, deconditioned  Neurology: Awake and alert, non focal  E ENT: mild pallor, no icterus, oral mucosa moist Cardiovascular: No JVD. S1-S2 present, rhythmic, no gallops, rubs, or murmurs. No lower extremity edema. Pulmonary:  positive breath sounds bilaterally, decreased air movement, no wheezing, rhonchi or rales. Gastrointestinal. Abdomen with no organomegaly, non tender, no rebound or guarding Skin. No rashes Musculoskeletal: no joint deformities     Data Reviewed: I have personally reviewed following labs and imaging studies  CBC: Recent Labs  Lab 08/16/19 1502 08/18/19 0426  WBC 3.5* 9.2  NEUTROABS 3.0 8.6*  HGB 10.1* 10.3*  HCT 38.3* 36.9*  MCV 100.0 94.4  PLT 147* 99991111   Basic Metabolic Panel: Recent Labs  Lab 08/16/19 1502 08/18/19 2126 08/19/19 0425  NA 136 137 138  K 4.4 4.9 4.5  CL 99 100 100  CO2 27 28 27   GLUCOSE 75 84 140*  BUN 24* 45* 33*  CREATININE 3.33* 4.93* 4.00*  CALCIUM 8.2* 8.3* 8.2*  PHOS  --  4.8*  --    GFR: Estimated Creatinine Clearance: 20.1 mL/min (A) (by C-G formula based on SCr of 4 mg/dL (H)). Liver Function Tests: Recent Labs  Lab 08/16/19 1502 08/18/19 2126 08/19/19 0425  AST 12*  --  13*  ALT 9  --  10  ALKPHOS 100  --  84  BILITOT 0.7  --  0.6  PROT 6.1*  --  5.8*  ALBUMIN 2.6* 2.1* 2.3*   No results for input(s): LIPASE, AMYLASE in the last 168 hours. No results for input(s): AMMONIA in  the last 168 hours. Coagulation Profile: No results for input(s): INR, PROTIME in the last 168 hours. Cardiac Enzymes: No results for input(s): CKTOTAL, CKMB, CKMBINDEX, TROPONINI in the last 168 hours. BNP (last 3 results) No results for input(s): PROBNP in the last 8760 hours. HbA1C: No results for input(s): HGBA1C in the last 72 hours. CBG: No results for input(s): GLUCAP in the last 168 hours. Lipid Profile: No results for input(s): CHOL, HDL, LDLCALC, TRIG, CHOLHDL, LDLDIRECT in the last 72 hours. Thyroid Function Tests: No results for input(s): TSH, T4TOTAL, FREET4, T3FREE, THYROIDAB in the last 72 hours. Anemia Panel: Recent Labs    08/18/19 0426 08/19/19 0425  FERRITIN 836* 785*      Radiology Studies: I have reviewed all of the imaging during this hospital visit personally     Scheduled Meds: . aspirin EC  81 mg Oral Daily  . atorvastatin  40 mg Oral Daily  . Chlorhexidine Gluconate Cloth  6 each Topical Q0600  . dexamethasone (DECADRON) injection  6 mg Intravenous Q24H  . feeding supplement (  NEPRO CARB STEADY)  237 mL Oral BID BM  . gabapentin  100 mg Oral TID  . heparin  5,000 Units Subcutaneous Q8H  . lidocaine  1 patch Transdermal Q24H  . multivitamin  1 tablet Oral QHS  . pantoprazole  40 mg Oral Daily  . sertraline  25 mg Oral Daily   Continuous Infusions: . remdesivir 100 mg in NS 250 mL Stopped (08/19/19 0801)     LOS: 3 days        Avenell Sellers Gerome Apley, MD

## 2019-08-19 NOTE — Progress Notes (Signed)
Patient ID: Stephanie Touhey, male   DOB: [birth year], 73 y.o.   MRN: [id number]   [facility] Progress Note   Assessment/ Plan:   1.  Acute hypoxic respiratory failure: Appears to be multifactorial from presence of a significant pleural effusion along with COVID-19 infection in a patient with end-stage renal disease.  With significant improvement of dyspnea following thoracentesis. 2.  End-stage renal disease: Usually on TTS dialysis schedule and underwent hemodialysis late yesterday evening.  He has been accepted to the designated COVID unit assigned by [facility] in [place].  If stable here, will order for hemodialysis tomorrow. 3.  Hypotension: Appears to have resolved with better blood pressures noted since admission/thoracentesis. 4.  Anemia of chronic kidney disease: Borderline hemoglobin and hematocrit, no overt loss and restarted ESA. 5.  Secondary hyperparathyroidism: Resume phosphorus binder along with vitamin D receptor analog/Sensipar for PTH suppression (awaiting outpatient records). 6.  Nutrition: Resume renal diet with oral protein supplementation and renal multivitamin.  Subjective:   Without acute events overnight, reports that he continues to feel better with regards to shortness of breath.   Objective:   BP 122/63 (BP Location: Left Arm)   Pulse 92   Temp 97.8 F (36.6 C) (Oral)   Resp 18   Ht 6' (1.829 m)   Wt 96.7 kg   SpO2 92%   BMI 28.91 kg/m   Physical Exam: Gen: Comfortably resting in bed CVS: Pulse regular rhythm, normal rate, S1 and S2 normal Resp: Anteriorly clear to auscultation, no rales/rhonchi Abd: Soft, flat, nontender Ext: Bilateral lower extremities in wraps with edema over edges  Labs: BMET Recent Labs  Lab 08/16/19 1502 08/18/19 2126 08/19/19 0425  NA 136 137 138  K 4.4 4.9 4.5  CL 99 100 100  CO2 27 28 27   GLUCOSE 75 84 140*  BUN 24* 45* 33*  CREATININE 3.33* 4.93* 4.00*  CALCIUM 8.2* 8.3* 8.2*  PHOS  --  4.8*  --     CBC Recent Labs  Lab 08/16/19 1502 08/18/19 0426  WBC 3.5* 9.2  NEUTROABS 3.0 8.6*  HGB 10.1* 10.3*  HCT 38.3* 36.9*  MCV 100.0 94.4  PLT 147* 155   Medications:    . aspirin EC  81 mg Oral Daily  . atorvastatin  40 mg Oral Daily  . Chlorhexidine Gluconate Cloth  6 each Topical Q0600  . dexamethasone (DECADRON) injection  6 mg Intravenous Q24H  . feeding supplement (NEPRO CARB STEADY)  237 mL Oral BID BM  . gabapentin  100 mg Oral TID  . heparin  5,000 Units Subcutaneous Q8H  . lidocaine  1 patch Transdermal Q24H  . multivitamin  1 tablet Oral QHS  . pantoprazole  40 mg Oral Daily  . sertraline  25 mg Oral Daily   [name], MD 08/19/2019, 8:33 AM

## 2019-08-19 NOTE — Progress Notes (Signed)
Renal Navigator received update from Attending/Dr. Cathlean Sauer and CSW/L. Ward Chatters of plan to discharge patient on Sunday, 08/21/19 after completion of Remdesivir. Renal Navigator notified OP HD clinics/[facility] (patient's home clinic) and [facility] (COVID positive isolation clinic) of plan. Patient will therefore start at the [facility] clinic on [address] in [place] on Tuesday, 08/23/19 for his first treatment there. Per CSW, patient can [facility] and they will provide transportation to [place] [facility] clinic.  Theodore Nguyen, [place] Renal Navigator [phone]

## 2019-08-20 LAB — COMPREHENSIVE METABOLIC PANEL
ALT: 11 U/L (ref 0–44)
AST: 13 U/L — ABNORMAL LOW (ref 15–41)
Albumin: 2.2 g/dL — ABNORMAL LOW (ref 3.5–5.0)
Alkaline Phosphatase: 73 U/L (ref 38–126)
Anion gap: 11 (ref 5–15)
BUN: 52 mg/dL — ABNORMAL HIGH (ref 8–23)
CO2: 27 mmol/L (ref 22–32)
Calcium: 8.6 mg/dL — ABNORMAL LOW (ref 8.9–10.3)
Chloride: 100 mmol/L (ref 98–111)
Creatinine, Ser: 5.12 mg/dL — ABNORMAL HIGH (ref 0.61–1.24)
GFR calc Af Amer: 12 mL/min — ABNORMAL LOW (ref >60)
GFR calc non Af Amer: 10 mL/min — ABNORMAL LOW (ref >60)
Glucose, Bld: 156 mg/dL — ABNORMAL HIGH (ref 70–99)
Potassium: 5.1 mmol/L (ref 3.5–5.1)
Sodium: 138 mmol/L (ref 135–145)
Total Bilirubin: 0.2 mg/dL — ABNORMAL LOW (ref 0.3–1.2)
Total Protein: 5.8 g/dL — ABNORMAL LOW (ref 6.5–8.1)

## 2019-08-20 LAB — CBC
HCT: 32.3 % — ABNORMAL LOW (ref 39.0–52.0)
Hemoglobin: 9.5 g/dL — ABNORMAL LOW (ref 13.0–17.0)
MCH: 26.7 pg (ref 26.0–34.0)
MCHC: 29.4 g/dL — ABNORMAL LOW (ref 30.0–36.0)
MCV: 90.7 fL (ref 80.0–100.0)
Platelets: 158 10*3/uL (ref 150–400)
RBC: 3.56 MIL/uL — ABNORMAL LOW (ref 4.22–5.81)
RDW: 17.1 % — ABNORMAL HIGH (ref 11.5–15.5)
WBC: 4.5 10*3/uL (ref 4.0–10.5)
nRBC: 0 % (ref 0.0–0.2)

## 2019-08-20 LAB — RENAL FUNCTION PANEL
Albumin: 2.2 g/dL — ABNORMAL LOW (ref 3.5–5.0)
Anion gap: 11 (ref 5–15)
BUN: 52 mg/dL — ABNORMAL HIGH (ref 8–23)
CO2: 27 mmol/L (ref 22–32)
Calcium: 8.5 mg/dL — ABNORMAL LOW (ref 8.9–10.3)
Chloride: 100 mmol/L (ref 98–111)
Creatinine, Ser: 5.05 mg/dL — ABNORMAL HIGH (ref 0.61–1.24)
GFR calc Af Amer: 12 mL/min — ABNORMAL LOW (ref >60)
GFR calc non Af Amer: 11 mL/min — ABNORMAL LOW (ref >60)
Glucose, Bld: 155 mg/dL — ABNORMAL HIGH (ref 70–99)
Phosphorus: 4.7 mg/dL — ABNORMAL HIGH (ref 2.5–4.6)
Potassium: 5.1 mmol/L (ref 3.5–5.1)
Sodium: 138 mmol/L (ref 135–145)

## 2019-08-20 LAB — FERRITIN: Ferritin: 719 ng/mL — ABNORMAL HIGH (ref 24–336)

## 2019-08-20 LAB — C-REACTIVE PROTEIN: CRP: 8.5 mg/dL — ABNORMAL HIGH (ref <1.0)

## 2019-08-20 LAB — D-DIMER, QUANTITATIVE: D-Dimer, Quant: 3.35 ug/mL-FEU — ABNORMAL HIGH (ref 0.00–0.50)

## 2019-08-20 LAB — HEPATITIS B SURFACE ANTIGEN: Hepatitis B Surface Ag: NEGATIVE

## 2019-08-20 LAB — HEPATITIS B CORE ANTIBODY, TOTAL: Hep B Core Total Ab: NEGATIVE

## 2019-08-20 MED ORDER — AMLODIPINE BESYLATE 5 MG PO TABS
5.0000 mg | ORAL_TABLET | Freq: Every day | ORAL | Status: DC
  Administered 2019-08-21 – 2019-08-26 (×5): 5 mg via ORAL
  Filled 2019-08-20 (×6): qty 1

## 2019-08-20 MED ORDER — GUAIFENESIN-DM 100-10 MG/5ML PO SYRP
5.0000 mL | ORAL_SOLUTION | Freq: Four times a day (QID) | ORAL | Status: DC
  Administered 2019-08-20 – 2019-08-24 (×16): 5 mL via ORAL
  Filled 2019-08-20 (×17): qty 5

## 2019-08-20 NOTE — Progress Notes (Signed)
Patient ID: Acari Lydecker, male   DOB: [birth year], 73 y.o.   MRN: [id number]  [facility] Progress Note   Assessment/ Plan:   1.  Acute hypoxic respiratory failure: Appears to be multifactorial from presence of a significant pleural effusion along with COVID-19 infection in a patient with end-stage renal disease.  With significant improvement of dyspnea following thoracentesis. 2.  End-stage renal disease: Usually on TTS dialysis schedule and will undergo hemodialysis today per his usual outpatient schedule.  He has been accepted to the designated COVID unit assigned by [facility] in [place]. Plans for possible discharge tomorrow after completion of 5-day course of Remdesivir. 3.  Hypotension: Appears to have resolved with better blood pressures noted since admission/thoracentesis. 4.  Anemia of chronic kidney disease: Borderline hemoglobin and hematocrit, no overt loss and restarted ESA. 5.  Secondary hyperparathyroidism:  Calcium and phosphorus currently within acceptable range, continue to follow as outpatient. 6.  Nutrition: Resume renal diet with oral protein supplementation and renal multivitamin.  Subjective:   He denies any acute events overnight and continues to improve with regards to shortness of breath.   Objective:   BP 132/77 (BP Location: Left Arm)   Pulse 94   Temp 99 F (37.2 C) (Axillary)   Resp 20   Ht 6' (1.829 m)   Wt 96.7 kg   SpO2 100%   BMI 28.91 kg/m   Physical Exam: Gen: Comfortably resting in bed, speaking on phone CVS: Pulse regular rhythm, normal rate, S1 and S2 normal Resp: Anteriorly clear to auscultation, no rales/rhonchi Abd: Soft, flat, nontender Ext: Bilateral lower extremities in wraps with edema over edges  Labs: BMET Recent Labs  Lab 08/16/19 1502 08/18/19 2126 08/19/19 0425 08/20/19 0706  NA 136 137 138 138  138  K 4.4 4.9 4.5 5.1  5.1  CL 99 100 100 100  100  CO2 27 28 27 27  27   GLUCOSE 75 84 140* 156*  155*   BUN 24* 45* 33* 52*  52*  CREATININE 3.33* 4.93* 4.00* 5.12*  5.05*  CALCIUM 8.2* 8.3* 8.2* 8.6*  8.5*  PHOS  --  4.8*  --  4.7*   CBC Recent Labs  Lab 08/16/19 1502 08/18/19 0426 08/20/19 0706  WBC 3.5* 9.2 4.5  NEUTROABS 3.0 8.6*  --   HGB 10.1* 10.3* 9.5*  HCT 38.3* 36.9* 32.3*  MCV 100.0 94.4 90.7  PLT 147* 155 158   Medications:    . aspirin EC  81 mg Oral Daily  . atorvastatin  40 mg Oral Daily  . Chlorhexidine Gluconate Cloth  6 each Topical Q0600  . dexamethasone (DECADRON) injection  6 mg Intravenous Q24H  . feeding supplement (NEPRO CARB STEADY)  237 mL Oral BID BM  . gabapentin  100 mg Oral TID  . heparin  5,000 Units Subcutaneous Q8H  . lidocaine  1 patch Transdermal Q24H  . multivitamin  1 tablet Oral QHS  . pantoprazole  40 mg Oral Daily  . sertraline  25 mg Oral Daily   [name], MD 08/20/2019, 9:06 AM

## 2019-08-20 NOTE — TOC Initial Note (Signed)
LATE NOTE SUBMISSION    Transition of Care [facility]) - Initial/Assessment Note    Patient Details  Name: Theodore Nguyen MRN: [id number] Date of Birth: 07-03-1946  Transition of Care [facility]) CM/SW Contact:    Geralynn Ochs, LCSW Phone Number: 08/20/2019, 8:32 AM  Clinical Narrative:   CSW contacted by renal navigator about patient needing HD in [place], unsure if patient's SNF can provide transportation to [place]. CSW reached out to Admissions at [facility] to discuss patient's change in dialysis, and Admissions confirmed with Administration that they will be able to provide transport for patient to dialysis to [facility]; they have other patients who are COVID+ with dialysis that they are already transporting, so they will ride together. Plan is for patient to return to [facility] when stable, CSW to follow.                 Expected Discharge Plan: Skilled Nursing Facility Barriers to Discharge: Continued Medical Work up, Waiting for outpatient dialysis   Patient Goals and CMS Choice        Expected Discharge Plan and Services Expected Discharge Plan: [place] Choice: [place] Living arrangements for the past 2 months: [place]                                      Prior Living Arrangements/Services Living arrangements for the past 2 months: [place] Lives with:: Facility Resident Patient language and need for interpreter reviewed:: No Do you feel safe going back to the place where you live?: Yes      Need for Family Participation in Patient Care: No (Comment) Care giver support system in place?: Yes (comment)   Criminal Activity/Legal Involvement Pertinent to Current Situation/Hospitalization: No - Comment as needed  Activities of Daily Living Home Assistive Devices/Equipment: Wheelchair ADL Screening (condition at time of admission) Patient's cognitive ability  adequate to safely complete daily activities?: Yes Is the patient deaf or have difficulty hearing?: No Does the patient have difficulty seeing, even when wearing glasses/contacts?: No Does the patient have difficulty concentrating, remembering, or making decisions?: No Patient able to express need for assistance with ADLs?: Yes Does the patient have difficulty dressing or bathing?: Yes Independently performs ADLs?: No Communication: Independent Dressing (OT): Dependent Is this a change from baseline?: Pre-admission baseline Grooming: Dependent Is this a change from baseline?: Pre-admission baseline Feeding: Needs assistance Is this a change from baseline?: Pre-admission baseline Bathing: Dependent Is this a change from baseline?: Pre-admission baseline Toileting: Dependent Is this a change from baseline?: Pre-admission baseline Walks in Home: Dependent(Non-ambulatory) Is this a change from baseline?: Pre-admission baseline Does the patient have difficulty walking or climbing stairs?: Yes Weakness of Legs: Both Weakness of Arms/Hands: Both  Permission Sought/Granted Permission sought to share information with : Facility Sport and exercise psychologist, Family Supports Permission granted to share information with : Yes, Verbal Permission Granted  Share Information with NAME: Carolin Sicks  Permission granted to share info w AGENCY: [place] granted to share info w Relationship: Daughter     Emotional Assessment Appearance:: Appears stated age     Orientation: : Oriented to Self, Oriented to Place, Oriented to  Time, Oriented to Situation Alcohol / Substance Use: Not Applicable Psych Involvement: No (comment)  Admission diagnosis:  ESRD (end stage renal disease) ([place]) [N18.6] Hypotension, unspecified hypotension type [I95.9] COVID-19  virus infection [U07.1] Patient Active Problem List   Diagnosis Date Noted  . Pressure injury of skin 08/17/2019  . Volume overload  08/16/2019  . ESRD (end stage renal disease) ([place]) 08/16/2019  . Essential hypertension 08/16/2019  . Type 2 diabetes mellitus with chronic kidney disease on chronic dialysis ([place]) 08/16/2019  . Anemia due to chronic kidney disease 08/16/2019  . COVID-19 virus infection 08/16/2019  . Chest pain 01/28/2017   PCP:  Eber Hong, MD Pharmacy:   [id number] - [place], [address] [place] [address] Phone: [phone] Fax: [phone]     Social Determinants of Health (SDOH) Interventions    Readmission Risk Interventions No flowsheet data found.

## 2019-08-20 NOTE — Progress Notes (Addendum)
PROGRESS NOTE    Theodore Nguyen  [id number] DOB: 05/17/1946 DOA: 08/16/2019 PCP: Eber Hong, MD    Brief Narrative:  73 year old male who is nursing home resident, nonambulatory for the last year, end-stage renal disease on hemodialysis, type 2 diabetes mellitus and hypertension. Reports 2 days of generalized malaise, progressive weakness, no fevers,no chills,no significant dyspnea. Today during hemodialysis he was hypotensive and hypoxic. On his initial physical examination blood pressure 93/54,heart rate 82, respiratory rate 14, saturation 88% on room air.Decreased breath sounds bilaterally, heart S1-S2 present rhythmic, abdomen soft, trace lower extremity edema. Sodium 136, potassium 4.4, chloride 99, bicarb 27, glucose 75, BUN 24, creatinine 3.3, white count 3.5, hemoglobin 10.1, hematocrit 38.3, platelets 147. SARS COVID-19 positive.Chest radiograph with large left pleural effusion, small right pleural effusion, congestive hilar regions. CT chest with left pleural effusion with left lower lobe andleft upper lobe atelectasis.  Patient will be admitted to the hospital with the working diagnosis of acute hypoxic respiratory failure due to volume overload in the setting of end-stage renal disease and acute SARS COVID-19 infection.  Dyspnea and respiratory failure improved with left thoracentesis and ultrafiltration. Considering high risk patient, decision was made to continue treatment for COVID 19 with Remdesivir and dexamethasone.   After completing 5 days of Remdesivir IV will dc to SNF.   Assessment & Plan:   Principal Problem:   COVID-19 virus infection Active Problems:   Volume overload   ESRD (end stage renal disease) (HCC)   Essential hypertension   Type 2 diabetes mellitus with chronic kidney disease on chronic dialysis ([place])   Anemia due to chronic kidney disease   Pressure injury of skin    1. Acute hypoxic respiratory failure, due to left pleural  effusion, volume overload and possible viral pneumonia due to COVID 19.spthoracentesis, 1000 ml pleural fluid removed. Transudate per Light's criteria. Positive cough but not dyspnea, his oxygenation is 100 % on one liter per minute per [place]. Will add guaifenesin for cough. Continue oxymetry monitoring. Plan for ultrafiltration today.   Plan to complete Remdesivir on 08/21/19 and dexamethasone on 08/26/19. Return to SNF in am, if continue to be stable. Ferritin and CRP are trending down.   2. ESRD on HD.scheduled to have HD today.    3. HTN. Blood pressure 123456 to 123456 systolic, will resume 5 mg of amlodipine for blood pressure control.   4. T2DM with dyslipidemia.Fasting glucose is 156 today. Tolerating po well, continue insulin sliding scale for glucose cover and monitoring.   5. Anemia of chronic renal disease. Stable, will need out patient follow up.  6. Coccyx pressure ulcer stage 2 present on admission.Local wound care. Patient is non ambulatory.   DVT prophylaxis:heparin Code Status:full Family Communication:no family at the bedside Disposition Plan/ discharge barriers:will plan to complete 5 days of remdesivir.    Body mass index is 28.91 kg/m. Malnutrition Type:  Nutrition Problem: Increased nutrient needs Etiology: acute illness, chronic illness   Malnutrition Characteristics:  Signs/Symptoms: estimated needs   Nutrition Interventions:  Interventions: Nep[place] shake  RN Pressure Injury Documentation: Pressure Injury 08/16/19 Coccyx Posterior Stage II -  Partial thickness loss of dermis presenting as a shallow open ulcer with a red, pink wound bed without slough. (Active)  08/16/19 2215  Location: Coccyx  Location Orientation: Posterior  Staging: Stage II -  Partial thickness loss of dermis presenting as a shallow open ulcer with a red, pink wound bed without slough.  Wound Description (Comments):   Present on Admission: Yes  Consultants:    Nephrology   Procedures:   Left thoracentesis US guided (1000 ml) transudate.   Antimicrobials:       Subjective: Patient is feeling better, not yet back to baseline, continue to have cough, but not frank dyspnea, no chest pain, no nausea or vomiting.   Objective: Vitals:   08/19/19 1608 08/19/19 2041 08/19/19 2333 08/20/19 0900  BP: 131/76  132/77 (!) 154/73  Pulse: 97  94 90  Resp: 20  20   Temp: 98.5 F (36.9 C) 98.8 F (37.1 C) 99 F (37.2 C) 97.7 F (36.5 C)  TempSrc: Oral Axillary Axillary Oral  SpO2: 92%  100% 100%  Weight:      Height:        Intake/Output Summary (Last 24 hours) at 08/20/2019 1155 Last data filed at 08/19/2019 1500 Gross per 24 hour  Intake 0 ml  Output -  Net 0 ml   Filed Weights   08/16/19 1024 08/19/19 0115 08/19/19 0430  Weight: 95.5 kg 98.7 kg 96.7 kg    Examination:   General: Not in pain or dyspnea, deconditioned.  Neurology: Awake and alert, non focal  E ENT: mild pallor, no icterus, oral mucosa moist Cardiovascular: No JVD. S1-S2 present, rhythmic, no gallops, rubs, or murmurs. No lower extremity edema. Pulmonary: positive breath sounds bilaterally, with no wheezing, rhonchi or rales. Gastrointestinal. Abdomen with, no organomegaly, non tender, no rebound or guarding Skin. No rashes Musculoskeletal: no joint deformities     Data Reviewed: I have personally reviewed following labs and imaging studies  CBC: Recent Labs  Lab 08/16/19 1502 08/18/19 0426 08/20/19 0706  WBC 3.5* 9.2 4.5  NEUTROABS 3.0 8.6*  --   HGB 10.1* 10.3* 9.5*  HCT 38.3* 36.9* 32.3*  MCV 100.0 94.4 90.7  PLT 147* 155 0000000   Basic Metabolic Panel: Recent Labs  Lab 08/16/19 1502 08/18/19 2126 08/19/19 0425 08/20/19 0706  NA 136 137 138 138  138  K 4.4 4.9 4.5 5.1  5.1  CL 99 100 100 100  100  CO2 27 28 27 27  27   GLUCOSE 75 84 140* 156*  155*  BUN 24* 45* 33* 52*  52*  CREATININE 3.33* 4.93* 4.00* 5.12*  5.05*  CALCIUM 8.2* 8.3*  8.2* 8.6*  8.5*  PHOS  --  4.8*  --  4.7*   GFR: Estimated Creatinine Clearance: 15.7 mL/min (A) (by C-G formula based on SCr of 5.12 mg/dL (H)). Liver Function Tests: Recent Labs  Lab 08/16/19 1502 08/18/19 2126 08/19/19 0425 08/20/19 0706  AST 12*  --  13* 13*  ALT 9  --  10 11  ALKPHOS 100  --  84 73  BILITOT 0.7  --  0.6 0.2*  PROT 6.1*  --  5.8* 5.8*  ALBUMIN 2.6* 2.1* 2.3* 2.2*  2.2*   No results for input(s): LIPASE, AMYLASE in the last 168 hours. No results for input(s): AMMONIA in the last 168 hours. Coagulation Profile: No results for input(s): INR, PROTIME in the last 168 hours. Cardiac Enzymes: No results for input(s): CKTOTAL, CKMB, CKMBINDEX, TROPONINI in the last 168 hours. BNP (last 3 results) No results for input(s): PROBNP in the last 8760 hours. HbA1C: No results for input(s): HGBA1C in the last 72 hours. CBG: No results for input(s): GLUCAP in the last 168 hours. Lipid Profile: No results for input(s): CHOL, HDL, LDLCALC, TRIG, CHOLHDL, LDLDIRECT in the last 72 hours. Thyroid Function Tests: No results for input(s): TSH, T4TOTAL, FREET4, T3FREE,  THYROIDAB in the last 72 hours. Anemia Panel: Recent Labs    08/19/19 0425 08/20/19 0706  FERRITIN 785* 719*      Radiology Studies: I have reviewed all of the imaging during this hospital visit personally     Scheduled Meds: . aspirin EC  81 mg Oral Daily  . atorvastatin  40 mg Oral Daily  . Chlorhexidine Gluconate Cloth  6 each Topical Q0600  . dexamethasone (DECADRON) injection  6 mg Intravenous Q24H  . feeding supplement (NEPRO CARB STEADY)  237 mL Oral BID BM  . gabapentin  100 mg Oral TID  . heparin  5,000 Units Subcutaneous Q8H  . lidocaine  1 patch Transdermal Q24H  . multivitamin  1 tablet Oral QHS  . pantoprazole  40 mg Oral Daily  . sertraline  25 mg Oral Daily   Continuous Infusions: . remdesivir 100 mg in NS 250 mL Stopped (08/20/19 0739)     LOS: 4 days         Vang Kraeger Gerome Apley, MD

## 2019-08-20 NOTE — NC FL2 (Signed)
[place] MEDICAID FL2 LEVEL OF CARE SCREENING TOOL     IDENTIFICATION  Patient Name: Theodore Nguyen Birthdate: 26-Dec-1945 Sex: male Admission Date (Current Location): 08/16/2019  [facility] and [place] Number:  Herbalist and Address:  [facility], [place] [address], [place], [place] [address]      Provider Number: [id number]  Attending Physician Name and Address:  Tawni Millers,*  Relative Name and Phone Number:  Lenward Dales, Daughter, [phone]    Current Level of Care: Hospital Recommended Level of Care: [place] Prior Approval Number:    Date Approved/Denied: 03/24/19 PASRR Number: [id number]  Discharge Plan: SNF    Current Diagnoses: Patient Active Problem List   Diagnosis Date Noted  . Pressure injury of skin 08/17/2019  . Volume overload 08/16/2019  . ESRD (end stage renal disease) ([place]) 08/16/2019  . Essential hypertension 08/16/2019  . Type 2 diabetes mellitus with chronic kidney disease on chronic dialysis ([place]) 08/16/2019  . Anemia due to chronic kidney disease 08/16/2019  . COVID-19 virus infection 08/16/2019  . Chest pain 01/28/2017    Orientation RESPIRATION BLADDER Height & Weight     Self, Time, Situation, Place  O2(99, [place], 1L) Continent, External catheter Weight: 209 lb 14.1 oz (95.2 kg) Height:  6' (182.9 cm)  BEHAVIORAL SYMPTOMS/MOOD NEUROLOGICAL BOWEL NUTRITION STATUS      Incontinent Diet(cardiac heart healthy diet, thin liquids)  AMBULATORY STATUS COMMUNICATION OF NEEDS Skin   Supervision Verbally Skin abrasions(pressure injury on coccyx)                       Personal Care Assistance Level of Assistance              Functional Limitations Info  Sight, Hearing, Speech Sight Info: Impaired Hearing Info: Adequate Speech Info: Adequate    SPECIAL CARE FACTORS FREQUENCY                       Contractures Contractures Info: Not present    Additional  Factors Info  Code Status, Allergies Code Status Info: Full Code Allergies Info: No Known Allergies           Current Medications (08/20/2019):  This is the current hospital active medication list Current Facility-Administered Medications  Medication Dose Route Frequency Provider Last Rate Last Dose  . acetaminophen (TYLENOL) tablet 650 mg  650 mg Oral Q6H PRN Arrien, Jimmy Picket, MD   650 mg at 08/17/19 2151   Or  . acetaminophen (TYLENOL) suppository 650 mg  650 mg Rectal Q6H PRN Arrien, Jimmy Picket, MD      . amLODipine (NORVASC) tablet 5 mg  5 mg Oral Daily Arrien, Jimmy Picket, MD      . aspirin EC tablet 81 mg  81 mg Oral Daily Tawni Millers, MD   81 mg at 08/20/19 0911  . atorvastatin (LIPITOR) tablet 40 mg  40 mg Oral Daily Arrien, Jimmy Picket, MD   40 mg at 08/20/19 0911  . Chlorhexidine Gluconate Cloth 2 % PADS 6 each  6 each Topical Q0600 [name], MD   6 each at 08/20/19 0535  . dexamethasone (DECADRON) injection 6 mg  6 mg Intravenous Q24H Tawni Millers, MD   6 mg at 08/19/19 2334  . feeding supplement (NEPRO CARB STEADY) liquid 237 mL  237 mL Oral BID BM Arrien, Jimmy Picket, MD 0 mL/hr at 08/19/19 2255 237 mL at 08/20/19 1443  .  gabapentin (NEURONTIN) capsule 100 mg  100 mg Oral TID Tawni Millers, MD   100 mg at 08/20/19 0911  . guaiFENesin-dextromethorphan (ROBITUSSIN DM) 100-10 MG/5ML syrup 5 mL  5 mL Oral Q6H Arrien, Jimmy Picket, MD   5 mL at 08/20/19 1443  . heparin injection 5,000 Units  5,000 Units Subcutaneous Q8H Arrien, Jimmy Picket, MD   5,000 Units at 08/20/19 [phone]  . lidocaine (LIDODERM) 5 % 1 patch  1 patch Transdermal Q24H Schorr, Rhetta Mura, NP   1 patch at 08/19/19 2254  . lidocaine (PF) (XYLOCAINE) 1 % injection   Infiltration PRN Ascencion Dike, PA-C   10 mL at 08/17/19 1130  . multivitamin (RENA-VIT) tablet 1 tablet  1 tablet Oral QHS [name], MD   1 tablet at 08/19/19 2253  . ondansetron (ZOFRAN)  tablet 4 mg  4 mg Oral Q6H PRN Arrien, Jimmy Picket, MD       Or  . ondansetron [facility]) injection 4 mg  4 mg Intravenous Q6H PRN Arrien, Jimmy Picket, MD      . pantoprazole (PROTONIX) EC tablet 40 mg  40 mg Oral Daily Arrien, Jimmy Picket, MD   40 mg at 08/20/19 0911  . remdesivir 100 mg in sodium chloride 0.9 % 250 mL IVPB  100 mg Intravenous Q24H Laren Everts, [facility]   Stopped at 08/20/19 0739  . sertraline (ZOLOFT) tablet 25 mg  25 mg Oral Daily Arrien, Jimmy Picket, MD   25 mg at 08/20/19 0911     Discharge Medications: Please see discharge summary for a list of discharge medications.  Relevant Imaging Results:  Relevant Lab Results:   Additional Information SSN: [id number]; Dialysis Schedule T, TH, [place], LCSWA

## 2019-08-21 DIAGNOSIS — J90 Pleural effusion, not elsewhere classified: Secondary | ICD-10-CM

## 2019-08-21 LAB — CULTURE, BLOOD (ROUTINE X 2)
Culture: NO GROWTH
Special Requests: ADEQUATE

## 2019-08-21 LAB — BASIC METABOLIC PANEL
Anion gap: 13 (ref 5–15)
BUN: 51 mg/dL — ABNORMAL HIGH (ref 8–23)
CO2: 25 mmol/L (ref 22–32)
Calcium: 8.9 mg/dL (ref 8.9–10.3)
Chloride: 100 mmol/L (ref 98–111)
Creatinine, Ser: 4.5 mg/dL — ABNORMAL HIGH (ref 0.61–1.24)
GFR calc Af Amer: 14 mL/min — ABNORMAL LOW (ref >60)
GFR calc non Af Amer: 12 mL/min — ABNORMAL LOW (ref >60)
Glucose, Bld: 134 mg/dL — ABNORMAL HIGH (ref 70–99)
Potassium: 4.8 mmol/L (ref 3.5–5.1)
Sodium: 138 mmol/L (ref 135–145)

## 2019-08-21 MED ORDER — NEPRO/CARBSTEADY PO LIQD: 237.0000 mL | Freq: Two times a day (BID) | ORAL | 0 refills | Status: DC

## 2019-08-21 MED ORDER — GUAIFENESIN-DM 100-10 MG/5ML PO SYRP: 5.0000 mL | ORAL_SOLUTION | Freq: Four times a day (QID) | ORAL | 0 refills | Status: DC | PRN

## 2019-08-21 NOTE — Progress Notes (Signed)
Msg sent to Dr.Blount whether she wanted to have pt on Tele or to DC the tele as ordered on day shift. Day Shift nurse kept tele on pt because he had a 10 beat run of VT during HD per report. Tele left on pt for the remainder of day shift and this shift to monitor for further ectopy. No ectopy noted thus far this shift.

## 2019-08-21 NOTE — Discharge Summary (Addendum)
Physician Discharge Summary  Theodore Nguyen [id number] DOB: 12-27-1945 DOA: 08/16/2019  PCP: Eber Hong, MD  Admit date: 08/16/2019 Discharge date: 08/21/2019  Admitted From: SNF  Disposition:  SNF  Recommendations for Outpatient Follow-up and new medication changes:  1. Follow up with Dr. Brynda Greathouse in 7 days.  2. Patient received  5 doses of Remdesivir and 5 doses of dexamethasone while hospitalized.  3. Will hold on losartan for now.     Home Health: na Equipment/Devices: na   Discharge Condition: stable  CODE STATUS: full  Diet recommendation:  Heart healthy   Brief/Interim Summary: 73 year old male who is a nursing home resident, nonambulatory for the last year, end-stage renal disease on hemodialysis, type 2 diabetes mellitus and hypertension. Reports 2 days of generalized malaise, progressive weakness, no fevers,no chills,no significant dyspnea. Today during hemodialysis he was hypotensive and hypoxic. On his initial physical examination blood pressure 93/54,heart rate 82, respiratory rate 14, oxygen saturation 88% on room air.Decreased breath sounds bilaterally, heart S1-S2 present rhythmic, abdomen soft, trace lower extremity edema. Sodium 136, potassium 4.4, chloride 99, bicarb 27, glucose 75, BUN 24, creatinine 3.3, white count 3.5, hemoglobin 10.1, hematocrit 38.3, platelets 147. SARS COVID-19 positive.Chest radiograph with large left pleural effusion, small right pleural effusion, congestive hilar regions. CT chest with left pleural effusion with left lower lobe andleft upper lobe atelectasis.  Patient was admitted to the hospital with the working diagnosis of acute hypoxic respiratory failure due to volume overload in the setting of end-stage renal disease and acute SARS COVID-19 infection.  Dyspnea and respiratory failure improved with left thoracentesis and ultrafiltration. Considering high risk patient, decision was made to continue treatment for COVID 19 with  Remdesivir and dexamethasone.   After completing 5 days of Remdesivir IV and dexamethasone 6 mg daily patient was discharged back to SNF.     1.  Acute hypoxic respiratory failure, due to left pleural effusion, volume overload and SARS COVID-19 viral pneumonia.  Patient was admitted to the progressive care unit, he received supplemental oxygen and close oximetry monitoring.  Underwent left ultrasound-guided thoracentesis, and 1000 milliliters of pleural fluid was obtained which was consistent with a transudate per Light's criteria.  He also underwent ultrafiltration with improvement of his volume status.  Patient was treated for SARS COVID-19 viral pneumonia with 5 doses of Remdesivir and 5 doses of 6 mg of dexamethasone.  He responded well to medical therapy.  His follow-up chest radiograph showed improvement of left pleural effusion, his oxygen saturations at discharge is 98% on room air.  2.  End-stage renal disease on hemodialysis.  Patient received hemodialysis with ultrafiltration with no major complications.  He had significant improvement of his volume status.  3.  Hypertension.  Initially his antihypertensive agents were held. HIs blood pressure stabilized and amlodipine was reintroduced.  Patient will resume his antihypertensive agents at discharge, isosorbide, carvedilol, and amlodipine. Will hold on losartan for now.   4.  Type 2 diabetes mellitus, with dyslipidemia.  Patient's glucose remained stable, he received insulin sliding scale for glucose coverage and monitoring.  Continue statin therapy.  5.  Anemia chronic renal disease.  His hemoglobin and hematocrit remained stable.  6.  Coccyx pressure ulcer stage II, present on admission.  Patient received local wound care.  7. Depression. Continue sertraline.   Discharge Diagnoses:  Principal Problem:   COVID-19 virus infection Active Problems:   Volume overload   ESRD (end stage renal disease) (HCC)   Essential hypertension    Type 2  diabetes mellitus with chronic kidney disease on chronic dialysis ([place])   Anemia due to chronic kidney disease   Pressure injury of skin   Pleural effusion on left    Discharge Instructions   Allergies as of 08/21/2019   No Known Allergies     Medication List    STOP taking these medications   losartan 50 MG tablet Commonly known as: COZAAR     TAKE these medications   amLODipine 5 MG tablet Commonly known as: NORVASC Take 5 mg by mouth daily.   aspirin EC 81 MG tablet Take 81 mg by mouth daily.   atorvastatin 40 MG tablet Commonly known as: LIPITOR Take 40 mg by mouth daily.   b complex-vitamin c-folic acid 0.8 MG Tabs tablet Take 1 tablet by mouth every morning.   carvedilol 6.25 MG tablet Commonly known as: COREG Take 6.25 mg by mouth daily.   feeding supplement (NEPRO CARB STEADY) Liqd Take 237 mLs by mouth 2 (two) times daily between meals.   gabapentin 100 MG capsule Commonly known as: NEURONTIN Take 100 mg by mouth 3 (three) times daily.   guaiFENesin-dextromethorphan 100-10 MG/5ML syrup Commonly known as: ROBITUSSIN DM Take 5 mLs by mouth every 6 (six) hours as needed for cough.   isosorbide mononitrate 30 MG 24 hr tablet Commonly known as: IMDUR Take 30 mg by mouth daily. HOLD FOR SYST9OLIC LEVELS [id number]   multivitamin with minerals Tabs tablet Take 1 tablet by mouth daily.   omeprazole 40 MG capsule Commonly known as: PRILOSEC Take 40 mg by mouth 2 (two) times daily before a meal.   ondansetron 4 MG tablet Commonly known as: ZOFRAN Take 4 mg by mouth every 8 (eight) hours as needed for nausea or vomiting.   sertraline 25 MG tablet Commonly known as: ZOLOFT Take 25 mg by mouth daily.   Tylenol 8 Hour 650 MG CR tablet Generic drug: acetaminophen Take 650 mg by mouth every 6 (six) hours.   Vitamin D3 50 MCG (2000 UT) Tabs Take 2,000 Units by mouth daily.       No Known Allergies  Consultations: Nephrology    Procedures/Studies: Ct Chest Wo Contrast  Result Date: 08/16/2019 CLINICAL DATA:  Chest pain, shortness of breath. EXAM: CT CHEST WITHOUT CONTRAST TECHNIQUE: Multidetector CT imaging of the chest was performed following the standard protocol without IV contrast. COMPARISON:  Radiographs of same day. FINDINGS: Cardiovascular: Atherosclerosis of thoracic aorta is noted. 4 cm ascending thoracic aortic aneurysm is noted. Dissection could not be evaluated for due to lack of intravenous contrast. Mild cardiomegaly is noted. No pericardial effusion is noted. Extensive coronary artery calcifications are noted. Mediastinum/Nodes: No enlarged mediastinal or axillary lymph nodes. Thyroid gland, trachea, and esophagus demonstrate no significant findings. Lungs/Pleura: No pneumothorax is noted. Large left pleural effusion is noted with complete atelectasis of the left lower lobe and significant atelectasis of the left upper lobe. Mild right pleural effusion is noted with adjacent atelectasis of the right lower lobe. Upper Abdomen: No acute abnormality. Musculoskeletal: Increased sclerosis of visualized skeleton is noted consistent with renal osteodystrophy. IMPRESSION: Large left pleural effusion is noted with complete atelectasis of the left lower lobe and significant atelectasis of the left upper lobe. Small right pleural effusion is noted with adjacent subsegmental atelectasis of the right lower lobe. 4 cm ascending thoracic aortic aneurysm. Recommend annual imaging followup by CTA or MRA. This recommendation follows 2010 ACCF/AHA/AATS/ACR/ASA/SCA/SCAI/SIR/STS/SVM Guidelines for the Diagnosis and Management of Patients with Thoracic Aortic Disease. Circulation. 2010;  121: [id number]. Aortic aneurysm NOS (ICD10-I71.9). Extensive coronary artery calcifications are noted concerning for coronary artery disease. Aortic Atherosclerosis (ICD10-I70.0). Electronically Signed   By: Marijo Conception M.D.   On: 08/16/2019 15:47    Dg Chest Port 1 View  Result Date: 08/17/2019 CLINICAL DATA:  Status post left thoracentesis EXAM: PORTABLE CHEST 1 VIEW COMPARISON:  08/16/2019 FINDINGS: Status post interval left thoracentesis with reduction in volume of a large left pleural effusion, now moderate, with improved aeration of the left lung. No significant pneumothorax. Small, layering right pleural effusion. Cardiomegaly. IMPRESSION: Status post interval left thoracentesis with reduction in volume of a large left pleural effusion, now moderate, with improved aeration of the left lung. No significant pneumothorax. Electronically Signed   By: Eddie Candle M.D.   On: 08/17/2019 12:19   Dg Chest Portable 1 View  Result Date: 08/16/2019 CLINICAL DATA:  Shortness of breath EXAM: PORTABLE CHEST 1 VIEW COMPARISON:  02/20/2017 FINDINGS: Cardiac shadow is enlarged but stable. Aortic calcifications are noted. Large left-sided pleural effusion is noted with likely underlying atelectasis/infiltrate. No bony abnormality is seen. IMPRESSION: Large left-sided pleural effusion. Likely underlying atelectasis/infiltrate is present. Electronically Signed   By: Inez Catalina M.D.   On: 08/16/2019 11:14   Ir Thoracentesis Asp Pleural Space W/img Guide  Result Date: 08/17/2019 INDICATION: Shortness of breath, left-sided pleural effusion in the presence of SARS COVID-19 infection. EXAM: ULTRASOUND GUIDED LEFT THORACENTESIS MEDICATIONS: None. COMPLICATIONS: None immediate. PROCEDURE: An ultrasound guided thoracentesis was thoroughly discussed with the patient and questions answered. The benefits, risks, alternatives and complications were also discussed. The patient understands and wishes to proceed with the procedure. Written consent was obtained. Ultrasound was performed to localize and mark an adequate pocket of fluid in the left chest. The area was then prepped and draped in the normal sterile fashion. 1% Lidocaine was used for local anesthesia. Under  ultrasound guidance a 6 Fr Safe-T-Centesis catheter was introduced. Thoracentesis was performed. The catheter was removed and a dressing applied. FINDINGS: A total of approximately 1 L of bloody pleural fluid was removed. Samples were sent to the laboratory as requested by the clinical team. IMPRESSION: Successful ultrasound guided left thoracentesis yielding 1 L of pleural fluid. Read by: Ascencion Dike PA-C Electronically Signed   By: Lucrezia Europe M.D.   On: 08/17/2019 11:51      Procedures: Left US guided thoracentesis (1000 ml)   Subjective: Patient is feeling better, dyspnea has improved, no nausea or vomiting, no chest pain.   Discharge Exam: Vitals:   08/21/19 0500 08/21/19 0554  BP:  (!) 143/86  Pulse:  89  Resp: 17   Temp:    SpO2:  100%   Vitals:   08/20/19 2351 08/21/19 0000 08/21/19 0500 08/21/19 0554  BP: 129/77   (!) 143/86  Pulse: 81   89  Resp: 16 20 17    Temp: 98 F (36.7 C)     TempSrc: Oral     SpO2: 98%   100%  Weight:      Height:        General: Not in pain or dyspnea.  Neurology: Awake and alert, non focal  E ENT: no pallor, no icterus, oral mucosa moist Cardiovascular: No JVD. S1-S2 present, rhythmic, no gallops, rubs, or murmurs. Trace lower extremity edema. Pulmonary: positive breath sounds bilaterally, adequate air movement, no wheezing, rhonchi or rales. Mild decreased breath sounds at left base.  Gastrointestinal. Abdomen with no organomegaly, non tender, no rebound or guarding Skin.  No rashes Musculoskeletal: no joint deformities   The results of significant diagnostics from this hospitalization (including imaging, microbiology, ancillary and laboratory) are listed below for reference.     Microbiology: Recent Results (from the past 240 hour(s))  SARS Coronavirus 2 [facility] order, Performed in [facility] hospital lab) Nasopharyngeal Nasopharyngeal Swab     Status: Abnormal   Collection Time: 08/16/19 10:40 AM   Specimen: Nasopharyngeal  Swab  Result Value Ref Range Status   SARS Coronavirus 2 POSITIVE (A) NEGATIVE Final    Comment: RESULT CALLED TO, READ BACK BY AND VERIFIED WITH: VOGLER T. AT 1217P ON [id number] BY THOMPSON S. Performed at [facility], [address] [place], [place] [address]   Blood culture (routine x 2)     Status: None (Preliminary result)   Collection Time: 08/16/19  3:02 PM   Specimen: BLOOD LEFT WRIST  Result Value Ref Range Status   Specimen Description BLOOD LEFT WRIST  Final   Special Requests   Final    BOTTLES DRAWN AEROBIC AND ANAEROBIC Blood Culture adequate volume   Culture   Final    NO GROWTH 4 DAYS Performed at [facility], [address], [place], [place] [address]    Report Status PENDING  Incomplete  MRSA PCR Screening     Status: None   Collection Time: 08/16/19 11:02 PM   Specimen: Nasopharyngeal  Result Value Ref Range Status   MRSA by PCR NEGATIVE NEGATIVE Final    Comment:        The GeneXpert MRSA Assay (FDA approved for NASAL specimens only), is one component of a comprehensive MRSA colonization surveillance program. It is not intended to diagnose MRSA infection nor to guide or monitor treatment for MRSA infections. Performed at [facility], [place] [address] [place], [place] [address]   Gram stain     Status: None   Collection Time: 08/17/19 11:55 AM   Specimen: Pleural, Left; Pleural Fluid  Result Value Ref Range Status   Specimen Description FLUID PLEURAL LEFT  Final   Special Requests NONE  Final   Gram Stain   Final    FEW WBC PRESENT, PREDOMINANTLY MONONUCLEAR NO ORGANISMS SEEN Performed at [facility], [place] [address], [place], [place] [address]    Report Status 08/18/2019 FINAL  Final  Culture, body fluid-bottle     Status: None (Preliminary result)   Collection Time: 08/17/19 11:55 AM   Specimen: Fluid  Result Value Ref Range Status   Specimen Description FLUID PLEURAL LEFT  Final   Special Requests NONE  Final   Culture   Final     NO GROWTH 3 DAYS Performed at [place] [address], [place], [place] [address]    Report Status PENDING  Incomplete     Labs: BNP (last 3 results) Recent Labs    08/16/19 1502  BNP [id number]*   Basic Metabolic Panel: Recent Labs  Lab 08/16/19 1502 08/18/19 2126 08/19/19 0425 08/20/19 0706  NA 136 137 138 138  138  K 4.4 4.9 4.5 5.1  5.1  CL 99 100 100 100  100  CO2 27 28 27 27  27   GLUCOSE 75 84 140* 156*  155*  BUN 24* 45* 33* 52*  52*  CREATININE 3.33* 4.93* 4.00* 5.12*  5.05*  CALCIUM 8.2* 8.3* 8.2* 8.6*  8.5*  PHOS  --  4.8*  --  4.7*   Liver Function Tests: Recent Labs  Lab 08/16/19 1502 08/18/19  2126 08/19/19 0425 08/20/19 0706  AST 12*  --  13* 13*  ALT 9  --  10 11  ALKPHOS 100  --  84 73  BILITOT 0.7  --  0.6 0.2*  PROT 6.1*  --  5.8* 5.8*  ALBUMIN 2.6* 2.1* 2.3* 2.2*  2.2*   No results for input(s): LIPASE, AMYLASE in the last 168 hours. No results for input(s): AMMONIA in the last 168 hours. CBC: Recent Labs  Lab 08/16/19 1502 08/18/19 0426 08/20/19 0706  WBC 3.5* 9.2 4.5  NEUTROABS 3.0 8.6*  --   HGB 10.1* 10.3* 9.5*  HCT 38.3* 36.9* 32.3*  MCV 100.0 94.4 90.7  PLT 147* 155 158   Cardiac Enzymes: No results for input(s): CKTOTAL, CKMB, CKMBINDEX, TROPONINI in the last 168 hours. BNP: Invalid input(s): POCBNP CBG: No results for input(s): GLUCAP in the last 168 hours. D-Dimer Recent Labs    08/19/19 0425 08/20/19 0706  DDIMER 3.13* 3.35*   Hgb A1c No results for input(s): HGBA1C in the last 72 hours. Lipid Profile No results for input(s): CHOL, HDL, LDLCALC, TRIG, CHOLHDL, LDLDIRECT in the last 72 hours. Thyroid function studies No results for input(s): TSH, T4TOTAL, T3FREE, THYROIDAB in the last 72 hours.  Invalid input(s): FREET3 Anemia work up [employer]    08/19/19 0425 08/20/19 0706  FERRITIN 785* 719*   Urinalysis No results found for: COLORURINE, APPEARANCEUR, LABSPEC, [place], GLUCOSEU,  HGBUR, BILIRUBINUR, KETONESUR, PROTEINUR, UROBILINOGEN, NITRITE, LEUKOCYTESUR Sepsis Labs Invalid input(s): PROCALCITONIN,  WBC,  [place] Microbiology Recent Results (from the past 240 hour(s))  SARS Coronavirus 2 [facility] order, Performed in [facility] hospital lab) Nasopharyngeal Nasopharyngeal Swab     Status: Abnormal   Collection Time: 08/16/19 10:40 AM   Specimen: Nasopharyngeal Swab  Result Value Ref Range Status   SARS Coronavirus 2 POSITIVE (A) NEGATIVE Final    Comment: RESULT CALLED TO, READ BACK BY AND VERIFIED WITH: VOGLER T. AT 1217P ON [id number] BY THOMPSON S. Performed at [facility], [address] [place], [place] [address]   Blood culture (routine x 2)     Status: None (Preliminary result)   Collection Time: 08/16/19  3:02 PM   Specimen: BLOOD LEFT WRIST  Result Value Ref Range Status   Specimen Description BLOOD LEFT WRIST  Final   Special Requests   Final    BOTTLES DRAWN AEROBIC AND ANAEROBIC Blood Culture adequate volume   Culture   Final    NO GROWTH 4 DAYS Performed at [facility], [address] [place], [place] [address]    Report Status PENDING  Incomplete  MRSA PCR Screening     Status: None   Collection Time: 08/16/19 11:02 PM   Specimen: Nasopharyngeal  Result Value Ref Range Status   MRSA by PCR NEGATIVE NEGATIVE Final    Comment:        The GeneXpert MRSA Assay (FDA approved for NASAL specimens only), is one component of a comprehensive MRSA colonization surveillance program. It is not intended to diagnose MRSA infection nor to guide or monitor treatment for MRSA infections. Performed at [facility], [place] [address] [place],  [address]   Gram stain     Status: None   Collection Time: 08/17/19 11:55 AM   Specimen: Pleural, Left; Pleural Fluid  Result Value Ref Range Status   Specimen Description FLUID PLEURAL LEFT  Final   Special Requests NONE  Final   Gram Stain   Final    FEW WBC PRESENT,  PREDOMINANTLY  MONONUCLEAR NO ORGANISMS SEEN Performed at [facility], [place] [address], [place], [place] [address]    Report Status 08/18/2019 FINAL  Final  Culture, body fluid-bottle     Status: None (Preliminary result)   Collection Time: 08/17/19 11:55 AM   Specimen: Fluid  Result Value Ref Range Status   Specimen Description FLUID PLEURAL LEFT  Final   Special Requests NONE  Final   Culture   Final    NO GROWTH 3 DAYS Performed at [place] [address], [place], [place] [address]    Report Status PENDING  Incomplete     Time coordinating discharge: 45 minutes  SIGNED:   Tawni Millers, MD  [facility] 08/21/2019, 8:33 AM

## 2019-08-21 NOTE — Progress Notes (Signed)
PT Cancellation Note  Patient Details Name: Theodore Nguyen MRN: [id number] DOB: June 15, 1946   Cancelled Treatment:    Reason Eval/Treat Not Completed: PT screened, no needs identified, will sign off. Pt is from long term care and has been nonambulatory >1 year. Recommend return to prior level of care.   Shary [facility] 08/21/2019, 2:32 PM Theodore Nguyen [place] Pager [phone] Office [phone]

## 2019-08-21 NOTE — Progress Notes (Signed)
Spoke with Dr.Blount and she stated order already put in to [facility] yesterday so DC the tele. Telemetry Station notified Tele being [place] and tele taken off of pt.

## 2019-08-21 NOTE — Progress Notes (Signed)
Patient ID: Theodore Nguyen, male   DOB: 02-Mar-1946, 73 y.o.   MRN: [id number] Patient will be discharged back to his skilled nursing facility this morning. No changes were made to his dialysis orders/EDW. He will be getting his dialysis at the designated [place] unit-[facility], [employer].  [name] MD [facility]. Office # [phone] Pager # [phone] 9:44 AM

## 2019-08-21 NOTE — TOC Progression Note (Signed)
Transition of Care [facility]) - Progression Note    Patient Details  Name: Malosi Niemczyk MRN: [id number] Date of Birth: [birth year]  Transition of Care [facility]) CM/SW [place], [place] Phone Number: 08/21/2019, 9:01 AM  Clinical Narrative:     CSW called Larene Beach with [facility] to inform her that the patient was ready for discharge. CSW asked if it would be possible for him to discharge back today. Larene Beach stated that they do not have any more COVID rooms. She stated that she would have to reach out to her boss to determine where he would go. She stated that they have been sending people to their sister facility, [facility].   She stated that they cannot figure it out today. They will figure out a placement plan for him in the morning.   CSW will continue to follow and assist with discharge planning.   Expected Discharge Plan: [place] Barriers to Discharge: Continued Medical Work up, Waiting for outpatient dialysis  Expected Discharge Plan and Services Expected Discharge Plan: [place] Choice: [place] [facility] Living arrangements for the past 2 months: [place] Expected Discharge Date: 08/21/19                                     Social Determinants of Health (SDOH) Interventions    Readmission Risk Interventions No flowsheet data found.

## 2019-08-21 NOTE — Progress Notes (Signed)
Notified by Jolee Ewing at this time pt had a 5 beat run of VT. Pt asymptomatic. Msg sent to Dr. Kennon Holter

## 2019-08-22 LAB — CULTURE, BODY FLUID W GRAM STAIN -BOTTLE: Culture: NO GROWTH

## 2019-08-22 NOTE — Progress Notes (Signed)
Patient continued to be stable for discharge.  His vital signs include a temperature of 98.7, blood pressure 171/82, heart rate 94, respirate 14, oxygen saturation 93% to 97% on room air.  His physical examination has not changed.  Pending transfer to skilled nursing facility.

## 2019-08-22 NOTE — Progress Notes (Addendum)
Renal Navigator and CSW/L. Ward Chatters have collaborated to make a plan for patient's discharge. Per CSW patient will go to [facility] SNF (s[facility] facility of [facility]) since [facility] does not have a bed at this time for patient. [facility] is able to accept patient tomorrow 9/15. In order to avoid day of discharge dialysis and since patient is medically ready for discharge, patient will be picked up by [facility] at approximately 10am on Tuesday, 9/15 and transported to OP HD clinic/[facility] for treatment at the [place] + isolation shift. Renal Navigator spoke with Charge RN/Angie, who requests that he arrive at 11am tomorrow.  CSW states [facility] is able to provide transportation for patient and will pick patient up after HD treatment at [facility]. Per Agricultural consultant, patient should be picked up at 4:30pm. CSW informed. Phone numbers provided to OP HD clinic for [facility] by Renal Navigator. Home OP HD clinic/[facility] kept up to date on patient's POC. Renal Navigator appreciates collaboration and communication by CSW in making plan for patient.  Alphonzo Cruise, [place] Renal Navigator [phone]

## 2019-08-22 NOTE — TOC Progression Note (Signed)
Transition of Care [facility]) - Progression Note    Patient Details  Name: Theodore Nguyen MRN: [id number] Date of Birth: [birth year]  Transition of Care [facility]) CM/SW [place], [place] Phone Number: 08/22/2019, 4:25 PM  Clinical Narrative:   CSW following for dsicharge plan. CSW worked on patient's plan throughout the day today.   This morning, CSW contacted [place] to ask about the patient's bed; patient is a long term resident at [facility], and [place] spoke with admissions last week about him returning. Per Admissions at [facility], they had a slew of new COVID cases over the weekend and now their [place] hall is full. Patient will need to go somewhere else, but they are working on finding him a place to go.  [place] called back and said that [facility] would be taking the patient, but couldn't take him today. Per [facility], [place] could take him tomorrow after his outpatient dialysis, if he could go from the hospital to outpatient dialysis and then to [facility] CSW spoke with renal coordinator to confirm, and then attempted to call [facility] to confirm; left a voicemail. CSW and Renal Coordinator had set up the patient's PTAR transport for tomorrow to dialysis.  [facility] called back and indicated that they could take him, but they could not provide transport to dialysis. [facility] is working on figuring out transportation for dialysis. CSW discussed with [facility] and they plan on taking the patient back at some point, but they have no COVID beds available for the foreseeable future; patient will have to go somewhere else.   CSW to continue to follow.    Expected Discharge Plan: [place] Barriers to Discharge: Continued Medical Work up, Waiting for outpatient dialysis  Expected Discharge Plan and Services Expected Discharge Plan: [place] Choice: [place] Living arrangements for the past 2 months: [place] Expected Discharge Date: 08/21/19                                     Social Determinants of Health (SDOH) Interventions    Readmission Risk Interventions No flowsheet data found.

## 2019-08-23 DIAGNOSIS — J90 Pleural effusion, not elsewhere classified: Secondary | ICD-10-CM

## 2019-08-23 DIAGNOSIS — L8942 Pressure ulcer of contiguous site of back, buttock and hip, stage 2: Secondary | ICD-10-CM

## 2019-08-23 LAB — CBC
HCT: 33.9 % — ABNORMAL LOW (ref 39.0–52.0)
Hemoglobin: 10.5 g/dL — ABNORMAL LOW (ref 13.0–17.0)
MCH: 27.8 pg (ref 26.0–34.0)
MCHC: 31 g/dL (ref 30.0–36.0)
MCV: 89.7 fL (ref 80.0–100.0)
Platelets: 204 10*3/uL (ref 150–400)
RBC: 3.78 MIL/uL — ABNORMAL LOW (ref 4.22–5.81)
RDW: 17.7 % — ABNORMAL HIGH (ref 11.5–15.5)
WBC: 8.7 10*3/uL (ref 4.0–10.5)
nRBC: 0 % (ref 0.0–0.2)

## 2019-08-23 LAB — RENAL FUNCTION PANEL
Albumin: 2.1 g/dL — ABNORMAL LOW (ref 3.5–5.0)
Anion gap: 12 (ref 5–15)
BUN: 89 mg/dL — ABNORMAL HIGH (ref 8–23)
CO2: 24 mmol/L (ref 22–32)
Calcium: 8.6 mg/dL — ABNORMAL LOW (ref 8.9–10.3)
Chloride: 100 mmol/L (ref 98–111)
Creatinine, Ser: 5.84 mg/dL — ABNORMAL HIGH (ref 0.61–1.24)
GFR calc Af Amer: 10 mL/min — ABNORMAL LOW (ref >60)
GFR calc non Af Amer: 9 mL/min — ABNORMAL LOW (ref >60)
Glucose, Bld: 166 mg/dL — ABNORMAL HIGH (ref 70–99)
Phosphorus: 4.8 mg/dL — ABNORMAL HIGH (ref 2.5–4.6)
Potassium: 6.2 mmol/L — ABNORMAL HIGH (ref 3.5–5.1)
Sodium: 136 mmol/L (ref 135–145)

## 2019-08-23 LAB — POTASSIUM: Potassium: 4.8 mmol/L (ref 3.5–5.1)

## 2019-08-23 MED ORDER — SODIUM CHLORIDE 0.9 % IV SOLN: 100.0000 mL | INTRAVENOUS | Status: DC | PRN

## 2019-08-23 MED ORDER — PENTAFLUOROPROP-TETRAFLUOROETH EX AERO: 1.0000 "application " | INHALATION_SPRAY | CUTANEOUS | Status: DC | PRN

## 2019-08-23 MED ORDER — LIDOCAINE HCL (PF) 1 % IJ SOLN: 5.0000 mL | INTRAMUSCULAR | Status: DC | PRN

## 2019-08-23 MED ORDER — HEPARIN SODIUM (PORCINE) 1000 UNIT/ML DIALYSIS
40.0000 [IU]/kg | INTRAMUSCULAR | Status: DC | PRN
  Filled 2019-08-23: qty 4

## 2019-08-23 MED ORDER — HEPARIN SODIUM (PORCINE) 1000 UNIT/ML DIALYSIS
40.0000 [IU]/kg | INTRAMUSCULAR | Status: DC | PRN
  Filled 2019-08-23 (×2): qty 4

## 2019-08-23 MED ORDER — HEPARIN SODIUM (PORCINE) 1000 UNIT/ML IJ SOLN
INTRAMUSCULAR | Status: AC
  Filled 2019-08-23: qty 4

## 2019-08-23 MED ORDER — LIDOCAINE-PRILOCAINE 2.5-2.5 % EX CREA
1.0000 "application " | TOPICAL_CREAM | CUTANEOUS | Status: DC | PRN
  Filled 2019-08-23: qty 5

## 2019-08-23 MED ORDER — HEPARIN SODIUM (PORCINE) 1000 UNIT/ML DIALYSIS
1000.0000 [IU] | INTRAMUSCULAR | Status: DC | PRN
  Filled 2019-08-23: qty 1

## 2019-08-23 MED ORDER — ALTEPLASE 2 MG IJ SOLR: 2.0000 mg | Freq: Once | INTRAMUSCULAR | Status: DC | PRN

## 2019-08-23 NOTE — Progress Notes (Signed)
Patient ID: Theodore Nguyen, male   DOB: 02/12/1946, 73 y.o.   MRN: [id number]  [facility] Progress Note   Assessment/ Plan:   1.  Acute hypoxic respiratory failure: Appears to be multifactorial from presence of a significant pleural effusion along with COVID-19 infection in a patient with end-stage renal disease.  With significant improvement of dyspnea following thoracentesis. Doing well and ok for dc now, but having some difficulty finding SNF 2.  End-stage renal disease: Usually HD is TTS. Plan HD today.  He has been accepted to the designated COVID unit assigned by [facility] in [place].  3.  Hypotension: Appears to have resolved with better blood pressures noted since admission/thoracentesis. 4.  Anemia of chronic kidney disease: Borderline hemoglobin and hematocrit, no overt loss and restarted ESA. 5.  Secondary hyperparathyroidism:  Calcium and phosphorus currently within acceptable range, continue to follow as outpatient. 6.  Nutrition: Resume renal diet with oral protein supplementation and renal multivitamin. 7.  COVID+ infection / PNA - per primary team  Kelly Splinter, MD 08/23/2019, 12:19 PM   Subjective:   Hx obtained from PMD, nursing, etc.   Objective:   BP (!) 141/77 (BP Location: Left Wrist)   Pulse 92   Temp 98.4 F (36.9 C) (Oral)   Resp 13   Ht 6' (1.829 m)   Wt 96.7 kg   SpO2 94%   BMI 28.91 kg/m   Physical Exam:  Patient not examined directly given COVID-19 + status, utilizing exam of the primary team and observations of RN's.    Labs: BMET Recent Labs  Lab 08/16/19 1502 08/18/19 2126 08/19/19 0425 08/20/19 0706 08/21/19 1025  NA 136 137 138 138  138 138  K 4.4 4.9 4.5 5.1  5.1 4.8  CL 99 100 100 100  100 100  CO2 27 28 27 27  27 25   GLUCOSE 75 84 140* 156*  155* 134*  BUN 24* 45* 33* 52*  52* 51*  CREATININE 3.33* 4.93* 4.00* 5.12*  5.05* 4.50*  CALCIUM 8.2* 8.3* 8.2* 8.6*  8.5* 8.9  PHOS  --  4.8*  --  4.7*  --     CBC Recent Labs  Lab 08/16/19 1502 08/18/19 0426 08/20/19 0706  WBC 3.5* 9.2 4.5  NEUTROABS 3.0 8.6*  --   HGB 10.1* 10.3* 9.5*  HCT 38.3* 36.9* 32.3*  MCV 100.0 94.4 90.7  PLT 147* 155 158   Medications:    . amLODipine  5 mg Oral Daily  . aspirin EC  81 mg Oral Daily  . atorvastatin  40 mg Oral Daily  . Chlorhexidine Gluconate Cloth  6 each Topical Q0600  . dexamethasone (DECADRON) injection  6 mg Intravenous Q24H  . feeding supplement (NEPRO CARB STEADY)  237 mL Oral BID BM  . gabapentin  100 mg Oral TID  . guaiFENesin-dextromethorphan  5 mL Oral Q6H  . heparin  5,000 Units Subcutaneous Q8H  . lidocaine  1 patch Transdermal Q24H  . multivitamin  1 tablet Oral QHS  . pantoprazole  40 mg Oral Daily  . sertraline  25 mg Oral Daily

## 2019-08-23 NOTE — Progress Notes (Signed)
Per CSW/L. [place], plan for patient to discharge to [facility] fell through because they are not able to transport patient to OP HD treatment. CSW continues to work on SNF placement. Renal Navigator canceled PTAR pick up this morning and notified OP HD clinic/[facility] that patient will not be there for treatment today. Patient will receive HD treatment in the hospital today.  Alphonzo Cruise, [place] Renal Navigator [phone]

## 2019-08-23 NOTE — Progress Notes (Signed)
Treatment complete goal not met due to low BP throughout treatment. K came back elevated  halfway through treatment ran on a 2 K bath. BP final 105/70 Pulse 98 lowest reading 76/58 @ 1800.

## 2019-08-23 NOTE — Progress Notes (Signed)
PROGRESS NOTE    Pacer Wehr  [id number] DOB: 1946-08-06 DOA: 08/16/2019 PCP: Eber Hong, MD    Brief Narrative:  73 year old male who is a nursing home resident, nonambulatory for the last year, end-stage renal disease on hemodialysis, type 2 diabetes mellitus and hypertension. Reports 2 days of generalized malaise, progressive weakness, no fevers,no chills,no significant dyspnea. Today during hemodialysis he was hypotensive and hypoxic. On his initial physical examination blood pressure 93/54,heart rate 82, respiratory rate 14, oxygen saturation 88% on room air.Decreased breath sounds bilaterally, heart S1-S2 present rhythmic, abdomen soft, trace lower extremity edema. Sodium 136, potassium 4.4, chloride 99, bicarb 27, glucose 75, BUN 24, creatinine 3.3, white count 3.5, hemoglobin 10.1, hematocrit 38.3, platelets 147. SARS COVID-19 positive.Chest radiograph with large left pleural effusion, small right pleural effusion, congestive hilar regions. CT chest with left pleural effusion with left lower lobe andleft upper lobe atelectasis.  Patient was admitted to the hospital with the working diagnosis of acute hypoxic respiratory failure due to volume overload in the setting of end-stage renal disease and acute SARS COVID-19 infection.  Dyspnea and respiratory failure improved with left thoracentesis (1000 ml) and ultrafiltration. Considering high risk patient, decision was made to continue treatment for COVID 19 with Remdesivir and dexamethasone.   After completing 5 days of Remdesivir IV and dexamethasone 6 mg daily patient was discharged back to SNF. Unfortunately SNF not able to take him back for now.    Assessment & Plan:   Principal Problem:   COVID-19 virus infection Active Problems:   Volume overload   ESRD (end stage renal disease) (HCC)   Essential hypertension   Type 2 diabetes mellitus with chronic kidney disease on chronic dialysis ([place])   Anemia due to  chronic kidney disease   Pressure injury of skin   Pleural effusion on left     1.  Acute hypoxic respiratory failure, due to left pleural effusion, volume overload and SARS COVID-19 viral pneumonia.  Patient with occasional couch, no dyspnea or chest pain. Has completed therapy for COVID 19 viral pneumonia with remdesivir and systemic steroids. His oxygen saturation is 94% on room air. Pending transfer back to SNF.   2.  End-stage renal disease on hemodialysis.  Plan for HD today per nephrology recommendations, clinically euvolemic. He has outpatient COVID HD unit arranged.   3.  Hypertension. Blood pressure controlled, will continue amlodipine,  141/ 77 mmHg today.   4.  Type 2 diabetes mellitus, with dyslipidemia.  glucose has remained stable, patient tolerating po well.   Continue statin therapy.  5.  Anemia chronic renal disease.  stable cell counts follow as outpatient.  6.  Coccyx pressure ulcer stage II, present on admission.  Continue with local wound care.  7. Depression. On sertraline, no confusion or agitation.     Body mass index is 28.91 kg/m. Malnutrition Type:  Nutrition Problem: Increased nutrient needs Etiology: acute illness, chronic illness   Malnutrition Characteristics:  Signs/Symptoms: estimated needs   Nutrition Interventions:  Interventions: Nepro shake  RN Pressure Injury Documentation: Pressure Injury 08/16/19 Coccyx Posterior Stage II -  Partial thickness loss of dermis presenting as a shallow open ulcer with a red, pink wound bed without slough. (Active)  08/16/19 2215  Location: Coccyx  Location Orientation: Posterior  Staging: Stage II -  Partial thickness loss of dermis presenting as a shallow open ulcer with a red, pink wound bed without slough.  Wound Description (Comments):   Present on Admission: Yes     Consultants:  Nephrology   Procedures:     Antimicrobials:       Subjective: Patient with intermittent  cough,   Objective: Vitals:   08/22/19 1600 08/22/19 2320 08/23/19 0833 08/23/19 1129  BP: (!) 152/90 132/78 (!) 154/85 (!) 141/77  Pulse: 98 94 94 92  Resp: 15 14  13   Temp: 98.6 F (37 C) 98.7 F (37.1 C) 97.8 F (36.6 C) 98.4 F (36.9 C)  TempSrc: Oral Oral Oral Oral  SpO2: 97% 99% 97% 94%  Weight:      Height:       No intake or output data in the 24 hours ending 08/23/19 1130 Filed Weights   08/20/19 1125 08/20/19 1435 08/22/19 0559  Weight: 97.2 kg 95.2 kg 96.7 kg    Examination:   General: Not in pain or dyspnea.  Neurology: Awake and alert, non focal  E ENT: mild pallor, no icterus, oral mucosa moist Cardiovascular: No JVD. S1-S2 present, rhythmic, no gallops, rubs, or murmurs. No lower extremity edema. Pulmonary: positive breath sounds bilaterally, adequate air movement, no wheezing, rhonchi or rales. Gastrointestinal. Abdomen with no organomegaly, non tender, no rebound or guarding Skin. No rashes Musculoskeletal: no joint deformities     Data Reviewed: I have personally reviewed following labs and imaging studies  CBC: Recent Labs  Lab 08/16/19 1502 08/18/19 0426 08/20/19 0706  WBC 3.5* 9.2 4.5  NEUTROABS 3.0 8.6*  --   HGB 10.1* 10.3* 9.5*  HCT 38.3* 36.9* 32.3*  MCV 100.0 94.4 90.7  PLT 147* 155 0000000   Basic Metabolic Panel: Recent Labs  Lab 08/16/19 1502 08/18/19 2126 08/19/19 0425 08/20/19 0706 08/21/19 1025  NA 136 137 138 138  138 138  K 4.4 4.9 4.5 5.1  5.1 4.8  CL 99 100 100 100  100 100  CO2 27 28 27 27  27 25   GLUCOSE 75 84 140* 156*  155* 134*  BUN 24* 45* 33* 52*  52* 51*  CREATININE 3.33* 4.93* 4.00* 5.12*  5.05* 4.50*  CALCIUM 8.2* 8.3* 8.2* 8.6*  8.5* 8.9  PHOS  --  4.8*  --  4.7*  --    GFR: Estimated Creatinine Clearance: 17.9 mL/min (A) (by C-G formula based on SCr of 4.5 mg/dL (H)). Liver Function Tests: Recent Labs  Lab 08/16/19 1502 08/18/19 2126 08/19/19 0425 08/20/19 0706  AST 12*  --  13* 13*   ALT 9  --  10 11  ALKPHOS 100  --  84 73  BILITOT 0.7  --  0.6 0.2*  PROT 6.1*  --  5.8* 5.8*  ALBUMIN 2.6* 2.1* 2.3* 2.2*  2.2*   No results for input(s): LIPASE, AMYLASE in the last 168 hours. No results for input(s): AMMONIA in the last 168 hours. Coagulation Profile: No results for input(s): INR, PROTIME in the last 168 hours. Cardiac Enzymes: No results for input(s): CKTOTAL, CKMB, CKMBINDEX, TROPONINI in the last 168 hours. BNP (last 3 results) No results for input(s): PROBNP in the last 8760 hours. HbA1C: No results for input(s): HGBA1C in the last 72 hours. CBG: No results for input(s): GLUCAP in the last 168 hours. Lipid Profile: No results for input(s): CHOL, HDL, LDLCALC, TRIG, CHOLHDL, LDLDIRECT in the last 72 hours. Thyroid Function Tests: No results for input(s): TSH, T4TOTAL, FREET4, T3FREE, THYROIDAB in the last 72 hours. Anemia Panel: No results for input(s): VITAMINB12, FOLATE, FERRITIN, TIBC, IRON, RETICCTPCT in the last 72 hours.    Radiology Studies: I have reviewed all of the  imaging during this hospital visit personally     Scheduled Meds: . amLODipine  5 mg Oral Daily  . aspirin EC  81 mg Oral Daily  . atorvastatin  40 mg Oral Daily  . Chlorhexidine Gluconate Cloth  6 each Topical Q0600  . dexamethasone (DECADRON) injection  6 mg Intravenous Q24H  . feeding supplement (NEPRO CARB STEADY)  237 mL Oral BID BM  . gabapentin  100 mg Oral TID  . guaiFENesin-dextromethorphan  5 mL Oral Q6H  . heparin  5,000 Units Subcutaneous Q8H  . lidocaine  1 patch Transdermal Q24H  . multivitamin  1 tablet Oral QHS  . pantoprazole  40 mg Oral Daily  . sertraline  25 mg Oral Daily   Continuous Infusions:   LOS: 7 days        Paolo Okane Gerome Apley, MD

## 2019-08-23 NOTE — Plan of Care (Signed)
Patient for Discharge today to SNF

## 2019-08-24 MED ORDER — CHLORHEXIDINE GLUCONATE CLOTH 2 % EX PADS: 6.0000 | MEDICATED_PAD | Freq: Every day | CUTANEOUS | Status: DC

## 2019-08-24 MED ORDER — GUAIFENESIN-DM 100-10 MG/5ML PO SYRP
10.0000 mL | ORAL_SOLUTION | Freq: Four times a day (QID) | ORAL | Status: DC
  Administered 2019-08-24 – 2019-08-26 (×8): 10 mL via ORAL
  Filled 2019-08-24 (×8): qty 10

## 2019-08-24 NOTE — Progress Notes (Signed)
Nutrition Follow-up  INTERVENTION:   -Nepro Shake po BID, each supplement provides 425 kcal and 19 grams protein  NUTRITION DIAGNOSIS:   Increased nutrient needs related to acute illness, chronic illness as evidenced by estimated needs.  Ongoing.  GOAL:   Patient will meet greater than or equal to 90% of their needs  Progressing.  MONITOR:   PO intake, Supplement acceptance, Labs, Weight trends, I & O's   ASSESSMENT:   73 y.o. male with medical history significant of type 2 diabetes mellitus, end-stage renal disease on hemodialysis, (Tuesday Thursday, Saturday), hypertension, dyslipidemia and iron deficiency anemia. 9/8: COVID+  **RD working remotely**  Patient currently consuming 100% of meals. Pt is drinking Nepro shakes. Last HD was 9/15, UF: 665 ml I/Os: -1155 ml since admit  Admission weight: 210 lbs. Current weight: 219 lbs.  Medications: Rena-Vit tablet daily Labs reviewed: Phos: 4.8 -elevated but steady  Diet Order:   Diet Order            Diet - low sodium heart healthy        Diet Heart Room service appropriate? Yes; Fluid consistency: Thin  Diet effective now              EDUCATION NEEDS:   No education needs have been identified at this time  Skin:  Skin Assessment: Skin Integrity Issues: Skin Integrity Issues:: Stage II Stage II: coccyx  Last BM:  9/16  Height:   Ht Readings from Last 1 Encounters:  08/16/19 6' (1.829 m)    Weight:   Wt Readings from Last 1 Encounters:  08/23/19 99.7 kg    Ideal Body Weight:  80.9 kg  BMI:  Body mass index is 29.81 kg/m.  Estimated Nutritional Needs:   Kcal:  2500-2700  Protein:  110-120g  Fluid:  UOP + 1L  Clayton Bibles, MS, RD, LDN Inpatient Clinical Dietitian Pager: [phone] After Hours Pager: [phone]

## 2019-08-24 NOTE — Progress Notes (Signed)
PROGRESS NOTE    Theodore Nguyen  [id number] DOB: 14-May-1946 DOA: 08/16/2019 PCP: Eber Hong, MD    Brief Narrative:  73 year old male who isanursing home resident, nonambulatory for the last year, end-stage renal disease on hemodialysis, type 2 diabetes mellitus and hypertension. Reports 2 days of generalized malaise, progressive weakness, no fevers,no chills,no significant dyspnea. Today during hemodialysis he was hypotensive and hypoxic. On his initial physical examination blood pressure 93/54,heart rate 82, respiratory rate 14,oxygen saturation 88% on room air.Decreased breath sounds bilaterally, heart S1-S2 present rhythmic, abdomen soft, trace lower extremity edema. Sodium 136, potassium 4.4, chloride 99, bicarb 27, glucose 75, BUN 24, creatinine 3.3, white count 3.5, hemoglobin 10.1, hematocrit 38.3, platelets 147. SARS COVID-19 positive.Chest radiograph with large left pleural effusion, small right pleural effusion, congestive hilar regions. CT chest with left pleural effusion with left lower lobe andleft upper lobe atelectasis.  Patient wasadmitted to the hospital with the working diagnosis of acute hypoxic respiratory failure due to volume overload in the setting of end-stage renal disease and acute SARS COVID-19 infection.  Dyspnea and respiratory failure improved with left thoracentesis (1000 ml) and ultrafiltration. Considering high risk patient, decision was made to continue treatment for COVID 19 with Remdesivir and dexamethasone.  After completing 5 days of Remdesivir IVand dexamethasone 6 mg daily plan is to  discharged back to SNF. Unfortunately SNF not able to take him back for now.    Assessment & Plan:   Principal Problem:   COVID-19 virus infection Active Problems:   Volume overload   ESRD (end stage renal disease) (HCC)   Essential hypertension   Type 2 diabetes mellitus with chronic kidney disease on chronic dialysis ([place])   Anemia due to  chronic kidney disease   Pressure injury of skin   Pleural effusion on left   1.Acute hypoxic respiratory failure, due to left pleural effusion, volume overload and SARS COVID-19 viral pneumonia.Intermittent cough, no dyspnea or chest pain. Pending transfer to SNF.   2.End-stage renal disease on hemodialysis with hyperkalemia.Continue with renal replacement therapy per nephrology recommendations. K yesterday 6,2 and repeat 4,8.   3.Hypertension.On amlodipine for blood pressure control.   4.Type 2 diabetes mellitus,with dyslipidemia.diabetic prudent diet, will continue statin therapy.  5.Anemia chronic renal disease.follow as outpatient.  6.Coccyx pressure ulcer stage II, present on admission.Local wound care.  7. Depression. Continue with sertraline, no confusion or agitation.      DVT prophylaxis: heparin   Code Status: full Family Communication: no family at the bedside  Disposition Plan/ discharge barriers: pending placement at SNF.   Body mass index is 29.81 kg/m. Malnutrition Type:  Nutrition Problem: Increased nutrient needs Etiology: acute illness, chronic illness   Malnutrition Characteristics:  Signs/Symptoms: estimated needs   Nutrition Interventions:  Interventions: Nepro shake  RN Pressure Injury Documentation: Pressure Injury 08/16/19 Coccyx Posterior Stage II -  Partial thickness loss of dermis presenting as a shallow open ulcer with a red, pink wound bed without slough. (Active)  08/16/19 2215  Location: Coccyx  Location Orientation: Posterior  Staging: Stage II -  Partial thickness loss of dermis presenting as a shallow open ulcer with a red, pink wound bed without slough.  Wound Description (Comments):   Present on Admission: Yes     Consultants:   Nephrology   Procedures:   Left thoracentesis   Antimicrobials:       Subjective: Patient is feeling well, intermittent dry cough but not sore throat or  dyspnea, no nausea or vomiting.   Objective: Vitals:  08/23/19 1938 08/23/19 2007 08/23/19 2259 08/24/19 0734  BP: 105/70 116/75 119/71 134/73  Pulse: 98 99 95 (!) 102  Resp:  18 20 18   Temp: 98.7 F (37.1 C) 98.3 F (36.8 C) 98.5 F (36.9 C) 99.5 F (37.5 C)  TempSrc: Oral Oral Oral Oral  SpO2: 100% 100% 100% 97%  Weight: 99.7 kg     Height:        Intake/Output Summary (Last 24 hours) at 08/24/2019 0811 Last data filed at 08/23/2019 2200 Gross per 24 hour  Intake 480 ml  Output 665 ml  Net -185 ml   Filed Weights   08/22/19 0559 08/23/19 1615 08/23/19 1938  Weight: 96.7 kg 100.2 kg 99.7 kg    Examination:   General: Not in pain or dyspnea, deconditioned  Neurology: Awake and alert, non focal  E ENT: mild pallor, no icterus, oral mucosa moist Cardiovascular: No JVD. S1-S2 present, rhythmic, no gallops, rubs, or murmurs. No lower extremity edema. Pulmonary: positive breath sounds bilaterally. Gastrointestinal. Abdomen with no organomegaly, non tender, no rebound or guarding Skin. No rashes Musculoskeletal: no joint deformities     Data Reviewed: I have personally reviewed following labs and imaging studies  CBC: Recent Labs  Lab 08/18/19 0426 08/20/19 0706 08/23/19 1700  WBC 9.2 4.5 8.7  NEUTROABS 8.6*  --   --   HGB 10.3* 9.5* 10.5*  HCT 36.9* 32.3* 33.9*  MCV 94.4 90.7 89.7  PLT 155 158 0000000   Basic Metabolic Panel: Recent Labs  Lab 08/18/19 2126 08/19/19 0425 08/20/19 0706 08/21/19 1025 08/23/19 1700 08/23/19 2053  NA 137 138 138   138 138 136  --   K 4.9 4.5 5.1   5.1 4.8 6.2* 4.8  CL 100 100 100   100 100 100  --   CO2 28 27 27   27 25 24   --   GLUCOSE 84 140* 156*   155* 134* 166*  --   BUN 45* 33* 52*   52* 51* 89*  --   CREATININE 4.93* 4.00* 5.12*   5.05* 4.50* 5.84*  --   CALCIUM 8.3* 8.2* 8.6*   8.5* 8.9 8.6*  --   PHOS 4.8*  --  4.7*  --  4.8*  --    GFR: Estimated Creatinine Clearance: 14 mL/min (A) (by C-G formula based on  SCr of 5.84 mg/dL (H)). Liver Function Tests: Recent Labs  Lab 08/18/19 2126 08/19/19 0425 08/20/19 0706 08/23/19 1700  AST  --  13* 13*  --   ALT  --  10 11  --   ALKPHOS  --  84 73  --   BILITOT  --  0.6 0.2*  --   PROT  --  5.8* 5.8*  --   ALBUMIN 2.1* 2.3* 2.2*   2.2* 2.1*   No results for input(s): LIPASE, AMYLASE in the last 168 hours. No results for input(s): AMMONIA in the last 168 hours. Coagulation Profile: No results for input(s): INR, PROTIME in the last 168 hours. Cardiac Enzymes: No results for input(s): CKTOTAL, CKMB, CKMBINDEX, TROPONINI in the last 168 hours. BNP (last 3 results) No results for input(s): PROBNP in the last 8760 hours. HbA1C: No results for input(s): HGBA1C in the last 72 hours. CBG: No results for input(s): GLUCAP in the last 168 hours. Lipid Profile: No results for input(s): CHOL, HDL, LDLCALC, TRIG, CHOLHDL, LDLDIRECT in the last 72 hours. Thyroid Function Tests: No results for input(s): TSH, T4TOTAL, FREET4, T3FREE, THYROIDAB in  the last 72 hours. Anemia Panel: No results for input(s): VITAMINB12, FOLATE, FERRITIN, TIBC, IRON, RETICCTPCT in the last 72 hours.    Radiology Studies: I have reviewed all of the imaging during this hospital visit personally     Scheduled Meds:  amLODipine  5 mg Oral Daily   aspirin EC  81 mg Oral Daily   atorvastatin  40 mg Oral Daily   Chlorhexidine Gluconate Cloth  6 each Topical Q0600   feeding supplement (NEPRO CARB STEADY)  237 mL Oral BID BM   gabapentin  100 mg Oral TID   guaiFENesin-dextromethorphan  5 mL Oral Q6H   heparin  5,000 Units Subcutaneous Q8H   lidocaine  1 patch Transdermal Q24H   multivitamin  1 tablet Oral QHS   pantoprazole  40 mg Oral Daily   sertraline  25 mg Oral Daily   Continuous Infusions:  sodium chloride     sodium chloride       LOS: 8 days        Makinzy Cleere Gerome Apley, MD

## 2019-08-24 NOTE — Progress Notes (Signed)
Patient ID: Evans Bily, male   DOB: June 16, 1946, 73 y.o.   MRN: [id number]  [facility] Progress Note   Assessment/ Plan:   1.  Acute hypoxic respiratory failure: Appears to be multifactorial from presence of a significant pleural effusion along with COVID-19 infection in a patient with end-stage renal disease.  With significant improvement of dyspnea following thoracentesis. Doing well and ok for dc now, but having some difficulty finding SNF 2.  End-stage renal disease: Usually HD is TTS. Plan HD tomorrow.  He has been accepted to the designated COVID unit assigned by [facility] in [place].  3.  Hypotension: Appears to have resolved with better blood pressures noted since admission/thoracentesis. 4.  Anemia of chronic kidney disease: Borderline hemoglobin and hematocrit, no overt loss and restarted ESA. 5.  Secondary hyperparathyroidism:  Calcium and phosphorus currently within acceptable range, continue to follow as outpatient. 6.  Nutrition: Resume renal diet with oral protein supplementation and renal multivitamin. 7.  COVID+ infection / PNA - per primary team  Kelly Splinter, MD 08/24/2019, 5:10 PM   Subjective:   Hx obtained from PMD, nursing, etc.   Objective:   BP 120/69 (BP Location: Left Arm)   Pulse 98   Temp 98.5 F (36.9 C) (Oral)   Resp 17   Ht 6' (1.829 m)   Wt 99.7 kg   SpO2 92%   BMI 29.81 kg/m   Physical Exam:  Patient not examined directly given COVID-19 + status, utilizing exam of the primary team and observations of RN's.    Labs: BMET Recent Labs  Lab 08/18/19 2126 08/19/19 0425 08/20/19 0706 08/21/19 1025 08/23/19 1700 08/23/19 2053  NA 137 138 138  138 138 136  --   K 4.9 4.5 5.1  5.1 4.8 6.2* 4.8  CL 100 100 100  100 100 100  --   CO2 28 27 27  27 25 24   --   GLUCOSE 84 140* 156*  155* 134* 166*  --   BUN 45* 33* 52*  52* 51* 89*  --   CREATININE 4.93* 4.00* 5.12*  5.05* 4.50* 5.84*  --   CALCIUM 8.3* 8.2* 8.6*  8.5*  8.9 8.6*  --   PHOS 4.8*  --  4.7*  --  4.8*  --    CBC Recent Labs  Lab 08/18/19 0426 08/20/19 0706 08/23/19 1700  WBC 9.2 4.5 8.7  NEUTROABS 8.6*  --   --   HGB 10.3* 9.5* 10.5*  HCT 36.9* 32.3* 33.9*  MCV 94.4 90.7 89.7  PLT 155 158 204   Medications:    . amLODipine  5 mg Oral Daily  . aspirin EC  81 mg Oral Daily  . atorvastatin  40 mg Oral Daily  . Chlorhexidine Gluconate Cloth  6 each Topical Q0600  . feeding supplement (NEPRO CARB STEADY)  237 mL Oral BID BM  . gabapentin  100 mg Oral TID  . guaiFENesin-dextromethorphan  10 mL Oral Q6H  . heparin  5,000 Units Subcutaneous Q8H  . lidocaine  1 patch Transdermal Q24H  . multivitamin  1 tablet Oral QHS  . pantoprazole  40 mg Oral Daily  . sertraline  25 mg Oral Daily

## 2019-08-24 NOTE — Progress Notes (Signed)
Renal Navigator received call from CSW/L. Paisley in the afternoon of 9/15 questioning the possibility of transferring patient to a [facility] clinic in [place] for OP HD treatment while he is COVID positive due to issues with SNF placement and transportation. (Patient's home clinic is a [place] [facility]). This may be a possibility, though Renal Navigator would like to discuss with patient's daughter first. Renal Navigator left message for patient's daughter to return call at her convenience. Renal Navigator spoke with CSW today who states patient's daughter is not agreeable for patient to be discharged to [facility] and we are, therefore, continuing to wait for a bed to reopen at patient's previous long term care facility/[facility].  Renal Navigator remains available for support and assistance as needed and asked CSW to keep Navigator updated as needed.  Alphonzo Cruise, [place] Renal Navigator [phone]

## 2019-08-24 NOTE — Progress Notes (Signed)
Coban dressings removed from bilateral lower extremities. Legs cleansed and moisturizer applied.  New coban dressing applied bilaterally to LE.

## 2019-08-24 NOTE — Progress Notes (Signed)
Renal Navigator received call back from patient's daughter/Theodore Nguyen who was very appreciative of Navigator's call to discuss plans for her father. Daughter states she has been upset with [facility] and feels his living situation there is the reason he has tested positive for COVID 19. She states she has been trying to get in touch with the administrator for 2 weeks. She also reports that there was discussion about transferring him to [facility] in [place], but she has read reviews about this facility and she absolutely does not want her father at this facility. Patient's daughter states she is agreeable to having her father moved to a facility in [place] as long as she is comfortable with the reviews. She states she lives in [place] and has young twins and a 72 year old with special needs, but just wants to know her father is well cared for. She states she cared for him until he needed skilled nursing level of care. She is scared for him and for her family due to the [place] pandemic and wants to know the facility he is in is taking all precautions seriously. Navigator validated her concerns. Renal Navigator explained role in arranging OP HD treatment and how he would need to change from a [place] facility to a [facility] facility with a different MD group following him if he moved from [place] to [place]. She states she is also agreeable to this. Renal Navigator explained that CSW is the person in the hospital who helps patients and families make SNF placement arrangements. She requests call from [facility]. Renal Navigator spoke with CSW/L. Paisley and asked for her to call daughter. Daughter again was very Patent attorney. Renal Navigator asked CSW for update once she speaks with patient's daughter.  Alphonzo Cruise, [place] Renal Navigator [phone]

## 2019-08-25 LAB — CBC
HCT: 32.2 % — ABNORMAL LOW (ref 39.0–52.0)
Hemoglobin: 9.8 g/dL — ABNORMAL LOW (ref 13.0–17.0)
MCH: 27.8 pg (ref 26.0–34.0)
MCHC: 30.4 g/dL (ref 30.0–36.0)
MCV: 91.2 fL (ref 80.0–100.0)
Platelets: 168 10*3/uL (ref 150–400)
RBC: 3.53 MIL/uL — ABNORMAL LOW (ref 4.22–5.81)
RDW: 18.3 % — ABNORMAL HIGH (ref 11.5–15.5)
WBC: 6.9 10*3/uL (ref 4.0–10.5)
nRBC: 0 % (ref 0.0–0.2)

## 2019-08-25 LAB — BASIC METABOLIC PANEL
Anion gap: 13 (ref 5–15)
BUN: 71 mg/dL — ABNORMAL HIGH (ref 8–23)
CO2: 23 mmol/L (ref 22–32)
Calcium: 8.4 mg/dL — ABNORMAL LOW (ref 8.9–10.3)
Chloride: 101 mmol/L (ref 98–111)
Creatinine, Ser: 5.03 mg/dL — ABNORMAL HIGH (ref 0.61–1.24)
GFR calc Af Amer: 12 mL/min — ABNORMAL LOW (ref >60)
GFR calc non Af Amer: 11 mL/min — ABNORMAL LOW (ref >60)
Glucose, Bld: 94 mg/dL (ref 70–99)
Potassium: 5.7 mmol/L — ABNORMAL HIGH (ref 3.5–5.1)
Sodium: 137 mmol/L (ref 135–145)

## 2019-08-25 MED ORDER — HEPARIN SODIUM (PORCINE) 1000 UNIT/ML IJ SOLN
INTRAMUSCULAR | Status: AC
  Administered 2019-08-25: 3000 [IU] via INTRAVENOUS_CENTRAL
  Filled 2019-08-25: qty 3

## 2019-08-25 MED ORDER — HEPARIN SODIUM (PORCINE) 1000 UNIT/ML DIALYSIS
3000.0000 [IU] | INTRAMUSCULAR | Status: DC | PRN
  Administered 2019-08-25: 15:00:00 3000 [IU] via INTRAVENOUS_CENTRAL
  Filled 2019-08-25 (×2): qty 3

## 2019-08-25 NOTE — Plan of Care (Signed)
  Problem: Activity: Goal: Risk for activity intolerance will decrease Outcome: Not Progressing   Problem: Elimination: Goal: Will not experience complications related to bowel motility Outcome: Not Progressing

## 2019-08-25 NOTE — Progress Notes (Signed)
PROGRESS NOTE    Theodore Nguyen  [id number] DOB: [birth year] DOA: 08/16/2019 PCP: Eber Hong, MD    Brief Narrative:  73 year old male who isanursing home resident, nonambulatory for the last year, end-stage renal disease on hemodialysis, type 2 diabetes mellitus and hypertension. Reports 2 days of generalized malaise, progressive weakness, no fevers,no chills,no significant dyspnea. Today during hemodialysis he was hypotensive and hypoxic. On his initial physical examination blood pressure 93/54,heart rate 82, respiratory rate 14,oxygen saturation 88% on room air.Decreased breath sounds bilaterally, heart S1-S2 present rhythmic, abdomen soft, trace lower extremity edema. Sodium 136, potassium 4.4, chloride 99, bicarb 27, glucose 75, BUN 24, creatinine 3.3, white count 3.5, hemoglobin 10.1, hematocrit 38.3, platelets 147. SARS COVID-19 positive.Chest radiograph with large left pleural effusion, small right pleural effusion, congestive hilar regions. CT chest with left pleural effusion with left lower lobe andleft upper lobe atelectasis.  Patient wasadmitted to the hospital with the working diagnosis of acute hypoxic respiratory failure due to volume overload in the setting of end-stage renal disease and acute SARS COVID-19 infection.  Dyspnea and respiratory failure improved with left thoracentesis(1000 ml)and ultrafiltration. Considering high risk patient, decision was made to continue treatment for COVID 19 with Remdesivir and dexamethasone.  After completing 5 days of Remdesivir IVand dexamethasone 6 mg daily plan is to  discharged back to SNF.Unfortunately SNF not able to take him back for now.     Assessment & Plan:   Principal Problem:   COVID-19 virus infection Active Problems:   Volume overload   ESRD (end stage renal disease) (HCC)   Essential hypertension   Type 2 diabetes mellitus with chronic kidney disease on chronic dialysis ([place])   Anemia due  to chronic kidney disease   Pressure injury of skin   Pleural effusion on left     1.Acute hypoxic respiratory failure, due to left pleural effusion, volume overload and SARS COVID-19 viral pneumonia.Patient denies any dyspnea, cough has been improving, continue to be very weak and deconditioned, oxymetry is 96% on room air.   2.End-stage renal disease on hemodialysis with hyperkalemia.patient with adequate volume status today, plan to continue with renal replacement therapy while hospitalized.   3.Hypertension. Continue with amlodipine for blood pressure control.   4.Type 2 diabetes mellitus,with dyslipidemia.Diet controlled glucose, will continue with satin therapy.  5.Anemia chronic renal disease.stable, will need outpatient follow up.  6.Coccyx pressure ulcer stage II, present on admission.Continue with local wound care.  7. Depression. On sertraline, no confusion or agitation.     DVT prophylaxis: heparin   Code Status: full Family Communication: no family at the bedside  Disposition Plan/ discharge barriers: pending placement at SNF.     Body mass index is 29.99 kg/m. Malnutrition Type:  Nutrition Problem: Increased nutrient needs Etiology: acute illness, chronic illness   Malnutrition Characteristics:  Signs/Symptoms: estimated needs   Nutrition Interventions:  Interventions: Nepro shake  RN Pressure Injury Documentation: Pressure Injury 08/16/19 Coccyx Posterior Stage II -  Partial thickness loss of dermis presenting as a shallow open ulcer with a red, pink wound bed without slough. (Active)  08/16/19 2215  Location: Coccyx  Location Orientation: Posterior  Staging: Stage II -  Partial thickness loss of dermis presenting as a shallow open ulcer with a red, pink wound bed without slough.  Wound Description (Comments):   Present on Admission: Yes     Consultants:   Nephrology   IR   Procedures:   Left  thoracentesis 1000 ml removed 08/17/19.  Antimicrobials:  Subjective: Patient reports improvement in his cough, no dyspnea, no nausea or vomiting, no chest pain. Continue to be very weak and deconditioned.   Objective: Vitals:   08/24/19 1553 08/24/19 2316 08/25/19 0533 08/25/19 0859  BP: 120/69 123/76  123/67  Pulse: 98 91  89  Resp: 17   15  Temp: 98.5 F (36.9 C) 98 F (36.7 C)  (!) 97.5 F (36.4 C)  TempSrc: Oral Oral  Oral  SpO2: 92% 95%  96%  Weight:   100.3 kg   Height:        Intake/Output Summary (Last 24 hours) at 08/25/2019 0859 Last data filed at 08/25/2019 0545 Gross per 24 hour  Intake 580 ml  Output -  Net 580 ml   Filed Weights   08/23/19 1615 08/23/19 1938 08/25/19 0533  Weight: 100.2 kg 99.7 kg 100.3 kg    Examination:   General: Not in pain or dyspnea, deconditioned  Neurology: Awake and alert, non focal  E ENT: mild pallor, no icterus, oral mucosa moist Cardiovascular: No JVD. S1-S2 present, rhythmic, no gallops, rubs, or murmurs. No lower extremity edema. Pulmonary: positive reath sounds bilaterally. Gastrointestinal. Abdomen with no organomegaly, non tender, no rebound or guarding Skin. No rashes Musculoskeletal: no joint deformities     Data Reviewed: I have personally reviewed following labs and imaging studies  CBC: Recent Labs  Lab 08/20/19 0706 08/23/19 1700  WBC 4.5 8.7  HGB 9.5* 10.5*  HCT 32.3* 33.9*  MCV 90.7 89.7  PLT 158 0000000   Basic Metabolic Panel: Recent Labs  Lab 08/18/19 2126 08/19/19 0425 08/20/19 0706 08/21/19 1025 08/23/19 1700 08/23/19 2053 08/25/19 0448  NA 137 138 138  138 138 136  --  137  K 4.9 4.5 5.1  5.1 4.8 6.2* 4.8 5.7*  CL 100 100 100  100 100 100  --  101  CO2 28 27 27  27 25 24   --  23  GLUCOSE 84 140* 156*  155* 134* 166*  --  94  BUN 45* 33* 52*  52* 51* 89*  --  71*  CREATININE 4.93* 4.00* 5.12*  5.05* 4.50* 5.84*  --  5.03*  CALCIUM 8.3* 8.2* 8.6*  8.5* 8.9 8.6*  --   8.4*  PHOS 4.8*  --  4.7*  --  4.8*  --   --    GFR: Estimated Creatinine Clearance: 16.3 mL/min (A) (by C-G formula based on SCr of 5.03 mg/dL (H)). Liver Function Tests: Recent Labs  Lab 08/18/19 2126 08/19/19 0425 08/20/19 0706 08/23/19 1700  AST  --  13* 13*  --   ALT  --  10 11  --   ALKPHOS  --  84 73  --   BILITOT  --  0.6 0.2*  --   PROT  --  5.8* 5.8*  --   ALBUMIN 2.1* 2.3* 2.2*  2.2* 2.1*   No results for input(s): LIPASE, AMYLASE in the last 168 hours. No results for input(s): AMMONIA in the last 168 hours. Coagulation Profile: No results for input(s): INR, PROTIME in the last 168 hours. Cardiac Enzymes: No results for input(s): CKTOTAL, CKMB, CKMBINDEX, TROPONINI in the last 168 hours. BNP (last 3 results) No results for input(s): PROBNP in the last 8760 hours. HbA1C: No results for input(s): HGBA1C in the last 72 hours. CBG: No results for input(s): GLUCAP in the last 168 hours. Lipid Profile: No results for input(s): CHOL, HDL, LDLCALC, TRIG, CHOLHDL, LDLDIRECT in the last 72 hours.  Thyroid Function Tests: No results for input(s): TSH, T4TOTAL, FREET4, T3FREE, THYROIDAB in the last 72 hours. Anemia Panel: No results for input(s): VITAMINB12, FOLATE, FERRITIN, TIBC, IRON, RETICCTPCT in the last 72 hours.    Radiology Studies: I have reviewed all of the imaging during this hospital visit personally     Scheduled Meds: . heparin      . amLODipine  5 mg Oral Daily  . aspirin EC  81 mg Oral Daily  . atorvastatin  40 mg Oral Daily  . Chlorhexidine Gluconate Cloth  6 each Topical Q0600  . feeding supplement (NEPRO CARB STEADY)  237 mL Oral BID BM  . gabapentin  100 mg Oral TID  . guaiFENesin-dextromethorphan  10 mL Oral Q6H  . heparin  5,000 Units Subcutaneous Q8H  . lidocaine  1 patch Transdermal Q24H  . multivitamin  1 tablet Oral QHS  . pantoprazole  40 mg Oral Daily  . sertraline  25 mg Oral Daily   Continuous Infusions: . sodium chloride     . sodium chloride       LOS: 9 days         Gerome Apley, MD

## 2019-08-25 NOTE — Progress Notes (Signed)
Patient ID: Theodore Nguyen, male   DOB: November 05, 1946, 73 y.o.   MRN: [id number]  [facility] Progress Note   Assessment/ Plan:   1.  Acute hypoxic respiratory failure: Appears to be multifactorial from presence of a significant pleural effusion along with COVID-19 infection in a patient with end-stage renal disease.  With significant improvement of dyspnea following thoracentesis. Doing well and ok for dc now, but having some difficulty finding SNF 2.  End-stage renal disease: Usually HD is TTS. HD today. He has been accepted to the designated COVID unit assigned by [facility] in [place].  3.  HTN/ volume excess: on multiple BP meds at home, BP's here were high now normal. Wt's are same as on admission around 98- 100 kg. Has sig bilat LE edema, advised to cut back on fluid intake.  4.  Anemia of chronic kidney disease: Borderline hemoglobin and hematocrit, no overt loss and restarted ESA. 5.  Secondary hyperparathyroidism:  Calcium and phosphorus currently within acceptable range, continue to follow as outpatient. 6.  Nutrition: Resume renal diet with oral protein supplementation and renal multivitamin. 7.  COVID+ infection / PNA - per primary team  Kelly Splinter, MD 08/25/2019, 4:16 PM   Subjective:   Hx obtained from PMD, nursing, etc.   Objective:   BP (!) 80/40   Pulse 92   Temp 98.2 F (36.8 C) (Oral)   Resp 18   Ht 6' (1.829 m)   Wt 99.6 kg   SpO2 91%   BMI 29.78 kg/m   Physical Exam:  chron ill apperaing, frail, lying flat, limited mobility  no resp distress, no jvd  Chest cta bilat to bases  Cor reg no mrg   Abd soft no ascites   Ext diffuse bilat LE edema dependent area   NF, gen'd weakness   Labs: BMET Recent Labs  Lab 08/18/19 2126 08/19/19 0425 08/20/19 0706 08/21/19 1025 08/23/19 1700 08/23/19 2053 08/25/19 0448  NA 137 138 138  138 138 136  --  137  K 4.9 4.5 5.1  5.1 4.8 6.2* 4.8 5.7*  CL 100 100 100  100 100 100  --  101  CO2 28 27  27  27 25 24   --  23  GLUCOSE 84 140* 156*  155* 134* 166*  --  94  BUN 45* 33* 52*  52* 51* 89*  --  71*  CREATININE 4.93* 4.00* 5.12*  5.05* 4.50* 5.84*  --  5.03*  CALCIUM 8.3* 8.2* 8.6*  8.5* 8.9 8.6*  --  8.4*  PHOS 4.8*  --  4.7*  --  4.8*  --   --    CBC Recent Labs  Lab 08/20/19 0706 08/23/19 1700 08/25/19 1512  WBC 4.5 8.7 6.9  HGB 9.5* 10.5* 9.8*  HCT 32.3* 33.9* 32.2*  MCV 90.7 89.7 91.2  PLT 158 204 168   Medications:    . amLODipine  5 mg Oral Daily  . aspirin EC  81 mg Oral Daily  . atorvastatin  40 mg Oral Daily  . Chlorhexidine Gluconate Cloth  6 each Topical Q0600  . feeding supplement (NEPRO CARB STEADY)  237 mL Oral BID BM  . gabapentin  100 mg Oral TID  . guaiFENesin-dextromethorphan  10 mL Oral Q6H  . heparin  5,000 Units Subcutaneous Q8H  . lidocaine  1 patch Transdermal Q24H  . multivitamin  1 tablet Oral QHS  . pantoprazole  40 mg Oral Daily  . sertraline  25 mg Oral Daily

## 2019-08-26 DIAGNOSIS — U071 COVID-19: Principal | ICD-10-CM

## 2019-08-26 LAB — BASIC METABOLIC PANEL
Anion gap: 11 (ref 5–15)
BUN: 45 mg/dL — ABNORMAL HIGH (ref 8–23)
CO2: 24 mmol/L (ref 22–32)
Calcium: 8.4 mg/dL — ABNORMAL LOW (ref 8.9–10.3)
Chloride: 102 mmol/L (ref 98–111)
Creatinine, Ser: 3.81 mg/dL — ABNORMAL HIGH (ref 0.61–1.24)
GFR calc Af Amer: 17 mL/min — ABNORMAL LOW (ref >60)
GFR calc non Af Amer: 15 mL/min — ABNORMAL LOW (ref >60)
Glucose, Bld: 111 mg/dL — ABNORMAL HIGH (ref 70–99)
Potassium: 4.6 mmol/L (ref 3.5–5.1)
Sodium: 137 mmol/L (ref 135–145)

## 2019-08-26 MED ORDER — CHLORHEXIDINE GLUCONATE CLOTH 2 % EX PADS: 6.0000 | MEDICATED_PAD | Freq: Every day | CUTANEOUS | Status: DC

## 2019-08-26 NOTE — Progress Notes (Signed)
Renal Navigator notified patient's OP HD clinics/[facility] (home) and [facility] (COVID shift) of patient's discharge back to [facility] today (per CSW) and start at [facility] tomorrow for OP HD.  Alphonzo Cruise, [place] Renal Navigator [phone]

## 2019-08-26 NOTE — Discharge Summary (Signed)
Physician Discharge Summary  Theodore Nguyen [id number] DOB: July 19, 1946 DOA: 08/16/2019  PCP: Eber Hong, MD  Admit date: 08/16/2019 Discharge date: 08/26/2019  Admitted From: SNF Discharge disposition: SNF   Code Status: Full Code  Diet Recommendation: Renal diet  Recommendations for Outpatient Follow-Up:   1. Follow-up with PCP 2. 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 Reports 2 days of generalized malaise, progressive weakness, no fevers,no chills,no significant dyspnea. Today during hemodialysis he was hypotensive and hypoxic. On his initial physical examination blood pressure 93/54,heart rate 82, respiratory rate 14,oxygen saturation 88% on room air.Decreased breath sounds bilaterally, heart S1-S2 present rhythmic, abdomen soft, trace lower extremity edema. Sodium 136, potassium 4.4, chloride 99, bicarb 27, glucose 75, BUN 24, creatinine 3.3, white count 3.5, hemoglobin 10.1, hematocrit 38.3, platelets 147. SARS COVID-19 positive.Chest radiograph with large left pleural effusion, small right pleural effusion, congestive hilar regions. CT chest with left pleural effusion with left lower lobe andleft upper lobe atelectasis.  Patient wasadmitted to the hospital with the working diagnosis of acute hypoxic respiratory failure due to volume overload in the setting of end-stage renal disease and acute SARS COVID-19  infection.  Dyspnea and respiratory failure improved with left thoracentesis(1000 ml)and ultrafiltration. Considering high risk patient, decision was made to continue treatment for COVID 19 with Remdesivir and dexamethasone.  After completing 5 days of Remdesivir IVand dexamethasone 6 mg daily, patient is ready to discharge back to SNF.  Subjective:  Seen and examined this morning.  Pleasant elderly male.  Not in distress this morning.  Not on oxygen supplementation  Hospital Course:  Acute hypoxic respiratory failure Left pleural effusion volume overload  SARS COVID-19 viral pneumonia.  -Dyspnea and respiratory failure improved with left thoracentesis(1000 ml)and ultrafiltration. -Treated with 5 days course of IV Remdesivir and dexamethasone. Patient denies any dyspnea, cough has been improving. Continue to be very weak and deconditioned, oximetry is 96% on room air.   2.End-stage renal disease on hemodialysiswith hyperkalemia.Nephrology consultation appreciated.  TTS dialysis schedule.  Apparently has been accepted to the designated COVID unit assigned by [facility] in [place].  3.Hypertension. Continue withamlodipine for blood pressure control.   4.Type 2 diabetes mellitus,with dyslipidemia.Diet controlled glucose, will continue with satin therapy.  5.Anemia chronic renal disease.stable, will need outpatient follow up.  6.Coccyx pressure ulcer stage II, present on admission.Continue with local wound care.  7. Depression. On sertraline, no confusion or agitation.  Stable for discharge to SNF today.  Discharge Exam:   Vitals:   08/25/19 1825 08/25/19 1948 08/26/19 0026 08/26/19 0730  BP: (!) 96/49 (!) 103/58 115/67 112/68  Pulse: 89  88 91  Resp: 19   18  Temp: 98.8 F (37.1 C)  97.7 F (36.5 C) 98.3 F (36.8 C)  TempSrc: Oral  Oral Oral  SpO2: 97%  98% 97%  Weight: 98.4 kg     Height:        Body mass index is 29.42 kg/m.  General exam: Appears calm and comfortable.  Skin: No rashes, lesions or ulcers. HEENT: Atraumatic, normocephalic, supple neck, no obvious bleeding Lungs: Clear to auscultation bilaterally  CVS: regular rate and rhythm, no murmur GI/Abd soft, nontender, nondistended, bowel sounds present CNS: Alert, awake, able to answer simple questions Psychiatry: Mood appropriate Extremities: No pedal edema, no calf tenderness  Discharge Instructions:  Wound care: None Discharge Instructions    MyChart COVID-19 home monitoring program   Complete by: Aug 21, 2019    Is the patient willing to use the MyChart Mobile App for home monitoring?: Yes   Temperature monitoring   Complete by: Aug 21, 2019    After how many days would you like to receive a notification of this patient's flowsheet entries?: 1   Diet - low sodium heart healthy   Complete by: As directed    Diet - low sodium heart healthy   Complete by: As directed    Discharge instructions   Complete by: As directed    Please follow with primary care in 7 days.   Increase activity slowly   Complete by: As directed    Increase activity slowly   Complete by: As directed      Follow-up Information    Eber Hong, MD Follow up in 1 week(s).   Specialty: Internal Medicine Contact information: [place] [address] [phone]          Allergies as of 08/26/2019   No Known Allergies     Medication List    STOP taking these medications   losartan 50 MG tablet Commonly known as: COZAAR     TAKE these medications   amLODipine 5 MG tablet Commonly known as: NORVASC Take 5 mg by mouth daily.   aspirin EC 81 MG tablet Take 81 mg by mouth daily.   atorvastatin 40 MG tablet Commonly known as: LIPITOR Take 40 mg by mouth daily.   b complex-vitamin c-folic acid 0.8 MG Tabs tablet Take 1 tablet by mouth every morning.   carvedilol 6.25 MG tablet Commonly known as: COREG Take 6.25 mg by mouth daily.    feeding supplement (NEPRO CARB STEADY) Liqd Take 237 mLs by mouth 2 (two) times daily between meals.   gabapentin 100 MG capsule Commonly known as: NEURONTIN Take 100 mg by mouth 3 (three) times daily.   guaiFENesin-dextromethorphan 100-10 MG/5ML syrup Commonly known as: ROBITUSSIN DM Take 5 mLs by mouth every 6 (six) hours as needed for cough.   isosorbide mononitrate 30 MG 24 hr tablet Commonly known as: IMDUR Take 30 mg by mouth daily. HOLD FOR SYST9OLIC LEVELS [id number]   multivitamin with minerals Tabs tablet Take 1 tablet by mouth daily.   omeprazole 40 MG capsule Commonly known as: PRILOSEC Take 40 mg by mouth 2 (two) times daily before a meal.   ondansetron 4 MG tablet Commonly known as: ZOFRAN Take 4 mg by mouth every 8 (eight) hours as needed for nausea or vomiting.   sertraline 25 MG tablet Commonly known as: ZOLOFT Take 25 mg by mouth daily.   Tylenol 8 Hour 650 MG CR tablet Generic drug: acetaminophen Take 650 mg by mouth every 6 (six) hours.   Vitamin D3 50 MCG (2000 UT) Tabs Take 2,000 Units by mouth daily.       Time coordinating discharge: 35 minutes  The results of significant diagnostics from this hospitalization (including imaging, microbiology, ancillary and laboratory) are listed below for reference.    Procedures and Diagnostic Studies:  Ct Chest Wo Contrast  Result Date: 08/16/2019 CLINICAL DATA:  Chest pain, shortness of breath. EXAM: CT CHEST WITHOUT CONTRAST TECHNIQUE: Multidetector CT imaging of the chest was performed following the standard protocol without IV contrast. COMPARISON:  Radiographs of same day. FINDINGS: Cardiovascular: Atherosclerosis of thoracic aorta is noted. 4 cm ascending thoracic aortic aneurysm is noted. Dissection could not be evaluated for due to lack of intravenous contrast. Mild cardiomegaly is noted. No pericardial effusion is noted. Extensive coronary artery calcifications are noted. Mediastinum/Nodes: No enlarged  mediastinal or axillary lymph nodes. Thyroid gland, trachea, and esophagus demonstrate no significant findings. Lungs/Pleura: No pneumothorax is noted. Large left pleural effusion is noted with complete atelectasis of the left lower lobe and significant atelectasis of the left upper lobe. Mild right pleural effusion is noted with adjacent atelectasis of the right lower lobe. Upper Abdomen: No acute abnormality. Musculoskeletal: Increased sclerosis of visualized skeleton is noted consistent with renal osteodystrophy. IMPRESSION: Large left pleural effusion is noted with complete atelectasis of the left lower lobe and significant atelectasis of the left upper lobe. Small right pleural effusion is noted with adjacent subsegmental atelectasis of the right lower lobe. 4 cm ascending thoracic aortic aneurysm. Recommend annual imaging followup by CTA or MRA. This recommendation follows 2010 ACCF/AHA/AATS/ACR/ASA/SCA/SCAI/SIR/STS/SVM Guidelines for the Diagnosis and Management of Patients with Thoracic Aortic Disease. Circulation. 2010; [id number]. Aortic aneurysm NOS (ICD10-I71.9). Extensive coronary artery calcifications are noted concerning for coronary artery disease. Aortic Atherosclerosis (ICD10-I70.0). Electronically Signed   By: Marijo Conception M.D.   On: 08/16/2019 15:47   Dg Chest Port 1 View  Result Date: 08/17/2019 CLINICAL DATA:  Status post left thoracentesis EXAM: PORTABLE CHEST 1 VIEW COMPARISON:  08/16/2019 FINDINGS: Status post interval left thoracentesis with reduction in volume of a large left pleural effusion, now moderate, with improved aeration of the left lung. No significant pneumothorax. Small, layering right pleural effusion. Cardiomegaly. IMPRESSION: Status post interval left thoracentesis with reduction in volume of a large left pleural effusion, now moderate, with improved aeration of the left lung. No significant pneumothorax. Electronically Signed   By: Eddie Candle M.D.   On:  08/17/2019 12:19   Dg Chest Portable 1 View  Result Date: 08/16/2019 CLINICAL DATA:  Shortness of breath EXAM: PORTABLE CHEST 1 VIEW COMPARISON:  02/20/2017 FINDINGS: Cardiac shadow is enlarged but stable. Aortic calcifications are noted. Large left-sided pleural effusion is noted with likely underlying atelectasis/infiltrate. No bony abnormality is seen. IMPRESSION: Large left-sided pleural effusion. Likely underlying atelectasis/infiltrate is present. Electronically Signed   By: Inez Catalina M.D.   On: 08/16/2019 11:14   Ir Thoracentesis Asp Pleural Space W/img Guide  Result Date: 08/17/2019 INDICATION: Shortness of breath, left-sided pleural effusion in the presence of SARS COVID-19 infection. EXAM: ULTRASOUND GUIDED LEFT THORACENTESIS MEDICATIONS: None. COMPLICATIONS: None immediate. PROCEDURE: An ultrasound guided thoracentesis was thoroughly discussed with the patient and questions answered. The benefits, risks, alternatives and complications were also discussed. The patient understands and wishes to proceed with the procedure. Written consent was obtained. Ultrasound was performed to localize and mark an adequate pocket of fluid in the left chest. The area was then prepped and draped in the normal sterile fashion. 1% Lidocaine was used for local anesthesia. Under ultrasound guidance a 6 Fr Safe-T-Centesis catheter was introduced. Thoracentesis was performed. The catheter was removed and a dressing applied. FINDINGS: A total of approximately 1 L of bloody pleural fluid was removed. Samples were sent to the laboratory as requested by the clinical  team. IMPRESSION: Successful ultrasound guided left thoracentesis yielding 1 L of pleural fluid. Read by: Ascencion Dike PA-C Electronically Signed   By: Lucrezia Europe M.D.   On: 08/17/2019 11:51     Labs:   Basic Metabolic Panel: Recent Labs  Lab 08/20/19 0706 08/21/19 1025 08/23/19 1700  08/25/19 0448 08/26/19 0449  NA 138  138 138 136  --  137 137   K 5.1  5.1 4.8 6.2*   < > 5.7* 4.6  CL 100  100 100 100  --  101 102  CO2 27  27 25 24   --  23 24  GLUCOSE 156*  155* 134* 166*  --  94 111*  BUN 52*  52* 51* 89*  --  71* 45*  CREATININE 5.12*  5.05* 4.50* 5.84*  --  5.03* 3.81*  CALCIUM 8.6*  8.5* 8.9 8.6*  --  8.4* 8.4*  PHOS 4.7*  --  4.8*  --   --   --    < > = values in this interval not displayed.   GFR Estimated Creatinine Clearance: 21.3 mL/min (A) (by C-G formula based on SCr of 3.81 mg/dL (H)). Liver Function Tests: Recent Labs  Lab 08/20/19 0706 08/23/19 1700  AST 13*  --   ALT 11  --   ALKPHOS 73  --   BILITOT 0.2*  --   PROT 5.8*  --   ALBUMIN 2.2*  2.2* 2.1*   No results for input(s): LIPASE, AMYLASE in the last 168 hours. No results for input(s): AMMONIA in the last 168 hours. Coagulation profile No results for input(s): INR, PROTIME in the last 168 hours.  CBC: Recent Labs  Lab 08/20/19 0706 08/23/19 1700 08/25/19 1512  WBC 4.5 8.7 6.9  HGB 9.5* 10.5* 9.8*  HCT 32.3* 33.9* 32.2*  MCV 90.7 89.7 91.2  PLT 158 204 168   Cardiac Enzymes: No results for input(s): CKTOTAL, CKMB, CKMBINDEX, TROPONINI in the last 168 hours. BNP: Invalid input(s): POCBNP CBG: No results for input(s): GLUCAP in the last 168 hours. D-Dimer No results for input(s): DDIMER in the last 72 hours. Hgb A1c No results for input(s): HGBA1C in the last 72 hours. Lipid Profile No results for input(s): CHOL, HDL, LDLCALC, TRIG, CHOLHDL, LDLDIRECT in the last 72 hours. Thyroid function studies No results for input(s): TSH, T4TOTAL, T3FREE, THYROIDAB in the last 72 hours.  Invalid input(s): FREET3 Anemia work up No results for input(s): VITAMINB12, FOLATE, FERRITIN, TIBC, IRON, RETICCTPCT in the last 72 hours. Microbiology Recent Results (from the past 240 hour(s))  Blood culture (routine x 2)     Status: None   Collection Time: 08/16/19  3:02 PM   Specimen: BLOOD LEFT WRIST  Result Value Ref Range Status   Specimen  Description BLOOD LEFT WRIST  Final   Special Requests   Final    BOTTLES DRAWN AEROBIC AND ANAEROBIC Blood Culture adequate volume   Culture   Final    NO GROWTH 5 DAYS Performed at [facility], [address] [place], [place] [address]    Report Status 08/21/2019 FINAL  Final  MRSA PCR Screening     Status: None   Collection Time: 08/16/19 11:02 PM   Specimen: Nasopharyngeal  Result Value Ref Range Status   MRSA by PCR NEGATIVE NEGATIVE Final    Comment:        The GeneXpert MRSA Assay (FDA approved for NASAL specimens only), is one component of a comprehensive MRSA colonization surveillance program. It  is not intended to diagnose MRSA infection nor to guide or monitor treatment for MRSA infections. Performed at [facility], [place] [address] [place], [place] [address]   Gram stain     Status: None   Collection Time: 08/17/19 11:55 AM   Specimen: Pleural, Left; Pleural Fluid  Result Value Ref Range Status   Specimen Description FLUID PLEURAL LEFT  Final   Special Requests NONE  Final   Gram Stain   Final    FEW WBC PRESENT, PREDOMINANTLY MONONUCLEAR NO ORGANISMS SEEN Performed at [facility], [place] [address], [place], [place] [address]    Report Status 08/18/2019 FINAL  Final  Culture, body fluid-bottle     Status: None   Collection Time: 08/17/19 11:55 AM   Specimen: Fluid  Result Value Ref Range Status   Specimen Description FLUID PLEURAL LEFT  Final   Special Requests NONE  Final   Culture   Final    NO GROWTH 5 DAYS Performed at [facility] [address] [place], [place] [address]    Report Status 08/22/2019 FINAL  Final    Please note: You were cared for by a hospitalist during your hospital stay. Once you are discharged, your primary care physician will handle any further medical issues. Please note that NO REFILLS for any discharge medications will be authorized once you are discharged, as it is imperative that you return to your  primary care physician (or establish a relationship with a primary care physician if you do not have one) for your post hospital discharge needs so that they can reassess your need for medications and monitor your lab values.  Signed: Marlowe Aschoff Lanika Colgate  [facility] 08/26/2019, 11:56 AM

## 2019-08-26 NOTE — Progress Notes (Signed)
Patient ID: Theodore Nguyen, male   DOB: [birth year], 73 y.o.   MRN: [id number]  [facility] Progress Note   Assessment/ Plan:   1.  Acute hypoxic respiratory failure: Appears to be multifactorial from presence of a significant pleural effusion along with COVID-19 infection in a patient with end-stage renal disease.  With significant improvement of dyspnea following thoracentesis. Doing well and ok for dc now, but having some difficulty finding SNF 2.  End-stage renal disease: Usually HD is TTS. Next HD Sat. He has been accepted to the designated COVID unit assigned by [facility] in [place].  3.  HTN/ volume excess: on 3 BP meds at home, just amlodipine here. BP's here were high, now normal. Wt's are same as on admission around 98- 100 kg. Has sig bilat LE edema, advised to cut back on fluid intake.  4.  Anemia of chronic kidney disease: Borderline hemoglobin and hematocrit, no overt loss and restarted ESA. 5.  Secondary hyperparathyroidism:  Calcium and phosphorus currently within acceptable range, continue to follow as outpatient. 6.  Nutrition: Resume renal diet with oral protein supplementation and renal multivitamin. 7.  COVID+ infection / PNA - per primary team  Kelly Splinter, MD 08/26/2019, 12:15 PM   Subjective:   Hx obtained from chart   Objective:   BP 112/68 (BP Location: Left Arm)   Pulse 91   Temp 98.3 F (36.8 C) (Oral)   Resp 18   Ht 6' (1.829 m)   Wt 98.4 kg   SpO2 97%   BMI 29.42 kg/m   Dialysis: [place] meds:  - amlodipine 5/ carvedilol 6.25 qd/ losartan 50 mwf\  - isosorbide mononitrate 30 qd/ aspirin 81/ atorvastatin 40 qd  - gabapentin 100 tid/ sertraline 25 qd  - omeprazole 40 bid  - prn's/ vitamins/ supplements    Physical Exam:   Patient not examined directly given COVID-19 + status, utilizing exam of the primary team and observations of RN's.     Labs: BMET Recent Labs  Lab 08/20/19 0706 08/21/19 1025  08/23/19 1700 08/23/19 2053 08/25/19 0448 08/26/19 0449  NA 138  138 138 136  --  137 137  K 5.1  5.1 4.8 6.2* 4.8 5.7* 4.6  CL 100  100 100 100  --  101 102  CO2 27  27 25 24   --  23 24  GLUCOSE 156*  155* 134* 166*  --  94 111*  BUN 52*  52* 51* 89*  --  71* 45*  CREATININE 5.12*  5.05* 4.50* 5.84*  --  5.03* 3.81*  CALCIUM 8.6*  8.5* 8.9 8.6*  --  8.4* 8.4*  PHOS 4.7*  --  4.8*  --   --   --    CBC Recent Labs  Lab 08/20/19 0706 08/23/19 1700 08/25/19 1512  WBC 4.5 8.7 6.9  HGB 9.5* 10.5* 9.8*  HCT 32.3* 33.9* 32.2*  MCV 90.7 89.7 91.2  PLT 158 204 168   Medications:    . amLODipine  5 mg Oral Daily  . aspirin EC  81 mg Oral Daily  . atorvastatin  40 mg Oral Daily  . Chlorhexidine Gluconate Cloth  6 each Topical Q0600  . feeding supplement (NEPRO CARB STEADY)  237 mL Oral BID BM  . gabapentin  100 mg Oral TID  . guaiFENesin-dextromethorphan  10 mL Oral Q6H  . heparin  5,000 Units Subcutaneous Q8H  . lidocaine  1 patch Transdermal Q24H  .  multivitamin  1 tablet Oral QHS  . pantoprazole  40 mg Oral Daily  . sertraline  25 mg Oral Daily

## 2019-08-26 NOTE — TOC Transition Note (Addendum)
Transition of Care [facility]) - CM/SW Discharge Note   Patient Details  Name: Theodore Nguyen MRN: [id number] Date of Birth: 1945/12/13  Transition of Care [facility]) CM/SW Contact:  Geralynn Ochs, LCSW Phone Number: 08/26/2019, 2:43 PM   Clinical Narrative:   Nurse to call report to [phone], Room 506B; Ask for [place].   CSW alerted this morning that patient can DC to [facility] today. CSW checked in with [facility] for parameters for when they could possibly look at maybe transferring patient, and he would need to be asymptomatic and have two negative COVID tests. It would still require administrator approval after that occurred. CSW contacted daughter, Carolin Sicks, and explained to her that there were no other options at this time except for the patient to return to [facility]. CSW explained how the patient could possibly be moved to [facility] after he doesn't need dialysis in [place] anymore, or there would be more options for him to transfer after he is testing negative. Daughter was crying during discussion, indicated that she hated her father being there but understood that there was nothing else that can be done at this time. Daughter appreciative of CSW assistance.    Final next level of care: Skilled Nursing Facility Barriers to Discharge: Barriers Resolved   Patient Goals and CMS Choice        Discharge Placement              Patient chooses bed at: [facility] Patient to be transferred to facility by: [place] Name of family member notified: [place] Patient and family notified of of transfer: 08/26/19  Discharge Plan and Services     Post Acute Care Choice: [place]                               Social Determinants of Health (SDOH) Interventions     Readmission Risk Interventions No flowsheet data found.

## 2019-08-26 NOTE — Progress Notes (Signed)
VSS. All belongings returned to patient. Report called by day shift RN. Paperwork given to [employer].

## 2019-08-26 NOTE — Progress Notes (Signed)
Report called to [facility] [place], [phone]. Unsure when [facility] will be able to transport, but it has been set up per SW. Will have patient ready.

## 2019-08-27 NOTE — TOC Progression Note (Signed)
LATE NOTE SUBMISSION   Transition of Care [facility]) - Progression Note    Patient Details  Name: Theodore Nguyen MRN: [id number] Date of Birth: 10-08-1946  Transition of Care [facility]) CM/SW [place], [place] Phone Number: 08/27/2019, 1:34 PM  Clinical Narrative:   CSW following for discharge plan. CSW reached out to both [facility] and [place] today for questions about patient's disposition. No response from [facility] all day. [place] indicated that they were still working on it, and asked if there was any possibility that patient would be switched from [facility] to [facility] clinic as that clinic is in [place] and would be easier for transport. CSW relayed request to renal navigator.   CSW also received update from medical team that patient's daughter is unhappy with [facility], does not want the patient moved anywhere else. CSW alerted [facility] that they needed to call the daughter and update her with what was going on, that she was upset. [place] indicated that they would call.    Expected Discharge Plan: [place] Barriers to Discharge: Barriers Resolved  Expected Discharge Plan and Services Expected Discharge Plan: [place] Choice: [place] Living arrangements for the past 2 months: [place] Expected Discharge Date: 08/26/19                                     Social Determinants of Health (SDOH) Interventions    Readmission Risk Interventions No flowsheet data found.

## 2019-08-27 NOTE — TOC Progression Note (Signed)
LATE NOTE SUBMISSION    Transition of Care [facility]) - Progression Note    Patient Details  Name: Theodore Nguyen MRN: [id number] Date of Birth: [birth year]  Transition of Care [facility]) CM/SW [place], [place] Phone Number: 08/27/2019, 1:39 PM  Clinical Narrative:   CSW following for discharge plan. CSW received update from [facility] earlier that they would now have a bed available for the patient, and he could return, either today or tomorrow. CSW spoke with patient's daughter, Theodore Nguyen, that she is unhappy with [facility] for a lot of reasons. Theodore Nguyen said she would love to try to find him somewhere else to go, and CSW discussed with her barriers as to why that would be difficult to do right now, including how he's COVID+, he's a dialysis patient, and he's long term care. Latosha indicated understanding.  CSW made calls to see if anyone else would be able to take the patient for long term care. [facility] is unable to take the patient until his dialysis is moved back to [place], but they do have long term care beds. [facility] has no long term care beds available, they would not be able to take the patient. [facility] is not able to take the patient as he is COVID+, and unsure of the requirements for after a person is able to test negative after the virus. [facility] admissions to call CSW back with information.  Patient still has not yet had dialysis today, unable to discharge. CSW to follow.    Expected Discharge Plan: [place] Barriers to Discharge: Barriers Resolved  Expected Discharge Plan and Services Expected Discharge Plan: [place] Choice: [place] Living arrangements for the past 2 months: [place] Expected Discharge Date: 08/26/19                                     Social Determinants of Health (SDOH) Interventions    Readmission Risk Interventions No  flowsheet data found.

## 2019-08-27 NOTE — TOC Progression Note (Signed)
Transition of Care [facility]) - Progression Note    Patient Details  Name: Theodore Nguyen MRN: [id number] Date of Birth: 11-24-1946  Transition of Care [facility]) CM/SW [place], [place] Phone Number: 08/27/2019, 1:43 PM  Clinical Narrative:   CSW notified by renal navigator that patient's daughter would like patient moved somewhere else, if possible. CSW attempted to see if there were any places in [place] that are accepting COVID+ patients. CSW reached out to [employer], [facility], [facility], and [facility], and they are all unable to accept patients who are COVID+ at this time.     Expected Discharge Plan: [place] Barriers to Discharge: Barriers Resolved  Expected Discharge Plan and Services Expected Discharge Plan: [place] Choice: [place] Living arrangements for the past 2 months: [place] Expected Discharge Date: 08/26/19                                     Social Determinants of Health (SDOH) Interventions    Readmission Risk Interventions No flowsheet data found.

## 2019-08-30 ENCOUNTER — Other Ambulatory Visit: Payer: Self-pay

## 2019-08-30 ENCOUNTER — Encounter: Payer: Self-pay | Admitting: Emergency Medicine

## 2019-08-30 ENCOUNTER — Emergency Department: Payer: Medicare Other

## 2019-08-30 ENCOUNTER — Inpatient Hospital Stay: Payer: Medicare Other

## 2019-08-30 ENCOUNTER — Inpatient Hospital Stay
Admission: EM | Admit: 2019-08-30 | Discharge: 2019-08-31 | DRG: 208 | Disposition: A | Discharge status: Other Acute Inpatient Hospital | Payer: Medicare Other | Attending: Internal Medicine | Admitting: Internal Medicine

## 2019-08-30 DIAGNOSIS — F329 Major depressive disorder, single episode, unspecified: Secondary | ICD-10-CM | POA: Diagnosis present

## 2019-08-30 DIAGNOSIS — D509 Iron deficiency anemia, unspecified: Secondary | ICD-10-CM | POA: Diagnosis present

## 2019-08-30 DIAGNOSIS — D631 Anemia in chronic kidney disease: Secondary | ICD-10-CM | POA: Diagnosis present

## 2019-08-30 DIAGNOSIS — E872 Acidosis: Secondary | ICD-10-CM | POA: Diagnosis present

## 2019-08-30 DIAGNOSIS — Z992 Dependence on renal dialysis: Secondary | ICD-10-CM | POA: Diagnosis not present

## 2019-08-30 DIAGNOSIS — J90 Pleural effusion, not elsewhere classified: Secondary | ICD-10-CM

## 2019-08-30 DIAGNOSIS — E7800 Pure hypercholesterolemia, unspecified: Secondary | ICD-10-CM | POA: Diagnosis present

## 2019-08-30 DIAGNOSIS — E785 Hyperlipidemia, unspecified: Secondary | ICD-10-CM | POA: Diagnosis present

## 2019-08-30 DIAGNOSIS — E1122 Type 2 diabetes mellitus with diabetic chronic kidney disease: Secondary | ICD-10-CM | POA: Diagnosis present

## 2019-08-30 DIAGNOSIS — N186 End stage renal disease: Secondary | ICD-10-CM | POA: Diagnosis present

## 2019-08-30 DIAGNOSIS — I959 Hypotension, unspecified: Secondary | ICD-10-CM | POA: Diagnosis present

## 2019-08-30 DIAGNOSIS — N2581 Secondary hyperparathyroidism of renal origin: Secondary | ICD-10-CM | POA: Diagnosis present

## 2019-08-30 DIAGNOSIS — Z7982 Long term (current) use of aspirin: Secondary | ICD-10-CM | POA: Diagnosis not present

## 2019-08-30 DIAGNOSIS — J9601 Acute respiratory failure with hypoxia: Secondary | ICD-10-CM | POA: Diagnosis present

## 2019-08-30 DIAGNOSIS — Z79899 Other long term (current) drug therapy: Secondary | ICD-10-CM | POA: Diagnosis not present

## 2019-08-30 DIAGNOSIS — I509 Heart failure, unspecified: Secondary | ICD-10-CM | POA: Diagnosis present

## 2019-08-30 DIAGNOSIS — U071 COVID-19: Secondary | ICD-10-CM | POA: Diagnosis present

## 2019-08-30 DIAGNOSIS — Z809 Family history of malignant neoplasm, unspecified: Secondary | ICD-10-CM | POA: Diagnosis not present

## 2019-08-30 DIAGNOSIS — J1289 Other viral pneumonia: Secondary | ICD-10-CM | POA: Diagnosis present

## 2019-08-30 DIAGNOSIS — I132 Hypertensive heart and chronic kidney disease with heart failure and with stage 5 chronic kidney disease, or end stage renal disease: Secondary | ICD-10-CM | POA: Diagnosis present

## 2019-08-30 DIAGNOSIS — Z978 Presence of other specified devices: Secondary | ICD-10-CM

## 2019-08-30 DIAGNOSIS — R0602 Shortness of breath: Secondary | ICD-10-CM | POA: Diagnosis present

## 2019-08-30 DIAGNOSIS — E875 Hyperkalemia: Secondary | ICD-10-CM | POA: Diagnosis present

## 2019-08-30 DIAGNOSIS — R600 Localized edema: Secondary | ICD-10-CM

## 2019-08-30 DIAGNOSIS — I82409 Acute embolism and thrombosis of unspecified deep veins of unspecified lower extremity: Secondary | ICD-10-CM

## 2019-08-30 DIAGNOSIS — R7989 Other specified abnormal findings of blood chemistry: Secondary | ICD-10-CM

## 2019-08-30 DIAGNOSIS — Z0189 Encounter for other specified special examinations: Secondary | ICD-10-CM

## 2019-08-30 HISTORY — DX: COVID-19: U07.1

## 2019-08-30 LAB — HEPATIC FUNCTION PANEL
ALT: 11 U/L (ref 0–44)
AST: 11 U/L — ABNORMAL LOW (ref 15–41)
Albumin: 2.2 g/dL — ABNORMAL LOW (ref 3.5–5.0)
Alkaline Phosphatase: 63 U/L (ref 38–126)
Bilirubin, Direct: 0.1 mg/dL (ref 0.0–0.2)
Indirect Bilirubin: 0.7 mg/dL (ref 0.3–0.9)
Total Bilirubin: 0.8 mg/dL (ref 0.3–1.2)
Total Protein: 5.9 g/dL — ABNORMAL LOW (ref 6.5–8.1)

## 2019-08-30 LAB — CBC WITH DIFFERENTIAL/PLATELET
Abs Immature Granulocytes: 0.03 10*3/uL (ref 0.00–0.07)
Basophils Absolute: 0 10*3/uL (ref 0.0–0.1)
Basophils Relative: 0 %
Eosinophils Absolute: 0.1 10*3/uL (ref 0.0–0.5)
Eosinophils Relative: 2 %
HCT: 29.4 % — ABNORMAL LOW (ref 39.0–52.0)
Hemoglobin: 8.5 g/dL — ABNORMAL LOW (ref 13.0–17.0)
Immature Granulocytes: 1 %
Lymphocytes Relative: 4 %
Lymphs Abs: 0.3 10*3/uL — ABNORMAL LOW (ref 0.7–4.0)
MCH: 26.6 pg (ref 26.0–34.0)
MCHC: 28.9 g/dL — ABNORMAL LOW (ref 30.0–36.0)
MCV: 92.2 fL (ref 80.0–100.0)
Monocytes Absolute: 0.4 10*3/uL (ref 0.1–1.0)
Monocytes Relative: 6 %
Neutro Abs: 5.8 10*3/uL (ref 1.7–7.7)
Neutrophils Relative %: 87 %
Platelets: 173 10*3/uL (ref 150–400)
RBC: 3.19 MIL/uL — ABNORMAL LOW (ref 4.22–5.81)
RDW: 19.2 % — ABNORMAL HIGH (ref 11.5–15.5)
WBC: 6.5 10*3/uL (ref 4.0–10.5)
nRBC: 0 % (ref 0.0–0.2)

## 2019-08-30 LAB — BASIC METABOLIC PANEL
Anion gap: 12 (ref 5–15)
BUN: 68 mg/dL — ABNORMAL HIGH (ref 8–23)
CO2: 26 mmol/L (ref 22–32)
Calcium: 8.7 mg/dL — ABNORMAL LOW (ref 8.9–10.3)
Chloride: 101 mmol/L (ref 98–111)
Creatinine, Ser: 5.19 mg/dL — ABNORMAL HIGH (ref 0.61–1.24)
GFR calc Af Amer: 12 mL/min — ABNORMAL LOW (ref >60)
GFR calc non Af Amer: 10 mL/min — ABNORMAL LOW (ref >60)
Glucose, Bld: 92 mg/dL (ref 70–99)
Potassium: 6 mmol/L — ABNORMAL HIGH (ref 3.5–5.1)
Sodium: 139 mmol/L (ref 135–145)

## 2019-08-30 LAB — SARS CORONAVIRUS 2 BY RT PCR (HOSPITAL ORDER, PERFORMED IN ~~LOC~~ HOSPITAL LAB): SARS Coronavirus 2: POSITIVE — AB

## 2019-08-30 LAB — MRSA PCR SCREENING: MRSA by PCR: NEGATIVE

## 2019-08-30 LAB — LACTIC ACID, PLASMA: Lactic Acid, Venous: 0.7 mmol/L (ref 0.5–1.9)

## 2019-08-30 LAB — GLUCOSE, CAPILLARY: Glucose-Capillary: 97 mg/dL (ref 70–99)

## 2019-08-30 MED ORDER — INSULIN ASPART 100 UNIT/ML ~~LOC~~ SOLN
0.0000 [IU] | Freq: Three times a day (TID) | SUBCUTANEOUS | Status: DC
  Administered 2019-08-31 (×2): 1 [IU] via SUBCUTANEOUS
  Filled 2019-08-30 (×2): qty 1

## 2019-08-30 MED ORDER — NEPRO/CARBSTEADY PO LIQD
237.0000 mL | Freq: Two times a day (BID) | ORAL | Status: DC
  Administered 2019-08-31: 09:00:00 237 mL via ORAL

## 2019-08-30 MED ORDER — RENA-VITE PO TABS
1.0000 | ORAL_TABLET | Freq: Every day | ORAL | Status: DC
  Administered 2019-08-30: 23:00:00 1 via ORAL
  Filled 2019-08-30: qty 1

## 2019-08-30 MED ORDER — VITAMIN D 25 MCG (1000 UNIT) PO TABS
2000.0000 [IU] | ORAL_TABLET | Freq: Every day | ORAL | Status: DC
  Administered 2019-08-30 – 2019-08-31 (×2): 2000 [IU] via ORAL
  Filled 2019-08-30 (×2): qty 2

## 2019-08-30 MED ORDER — NOREPINEPHRINE 4 MG/250ML-% IV SOLN
INTRAVENOUS | Status: AC
  Filled 2019-08-30: qty 250

## 2019-08-30 MED ORDER — CALCIUM GLUCONATE-NACL 1-0.675 GM/50ML-% IV SOLN
1.0000 g | Freq: Once | INTRAVENOUS | Status: AC
  Administered 2019-08-30: 16:00:00 1000 mg via INTRAVENOUS
  Filled 2019-08-30: qty 50

## 2019-08-30 MED ORDER — ADULT MULTIVITAMIN W/MINERALS CH
1.0000 | ORAL_TABLET | Freq: Every day | ORAL | Status: DC
  Administered 2019-08-30 – 2019-08-31 (×2): 1 via ORAL
  Filled 2019-08-30 (×2): qty 1

## 2019-08-30 MED ORDER — POLYETHYLENE GLYCOL 3350 17 G PO PACK: 17.0000 g | PACK | Freq: Every day | ORAL | Status: DC | PRN

## 2019-08-30 MED ORDER — SERTRALINE HCL 50 MG PO TABS
25.0000 mg | ORAL_TABLET | Freq: Every day | ORAL | Status: DC
  Administered 2019-08-30 – 2019-08-31 (×2): 25 mg via ORAL
  Filled 2019-08-30 (×2): qty 1

## 2019-08-30 MED ORDER — SODIUM CHLORIDE 0.9 % IV BOLUS
250.0000 mL | Freq: Once | INTRAVENOUS | Status: AC
  Administered 2019-08-30: 250 mL via INTRAVENOUS

## 2019-08-30 MED ORDER — PANTOPRAZOLE SODIUM 40 MG PO TBEC
40.0000 mg | DELAYED_RELEASE_TABLET | Freq: Every day | ORAL | Status: DC
  Administered 2019-08-30 – 2019-08-31 (×2): 40 mg via ORAL
  Filled 2019-08-30 (×2): qty 1

## 2019-08-30 MED ORDER — INSULIN ASPART 100 UNIT/ML IV SOLN
5.0000 [IU] | Freq: Once | INTRAVENOUS | Status: AC
  Administered 2019-08-30: 18:00:00 5 [IU] via INTRAVENOUS
  Filled 2019-08-30: qty 0.05

## 2019-08-30 MED ORDER — HEPARIN SODIUM (PORCINE) 1000 UNIT/ML DIALYSIS
1000.0000 [IU] | INTRAMUSCULAR | Status: DC | PRN
  Filled 2019-08-30: qty 6

## 2019-08-30 MED ORDER — ATORVASTATIN CALCIUM 20 MG PO TABS
40.0000 mg | ORAL_TABLET | Freq: Every day | ORAL | Status: DC
  Administered 2019-08-30 – 2019-08-31 (×2): 40 mg via ORAL
  Filled 2019-08-30 (×2): qty 2

## 2019-08-30 MED ORDER — SODIUM BICARBONATE 8.4 % IV SOLN
50.0000 meq | Freq: Once | INTRAVENOUS | Status: AC
  Administered 2019-08-30: 18:00:00 50 meq via INTRAVENOUS
  Filled 2019-08-30: qty 50

## 2019-08-30 MED ORDER — GABAPENTIN 100 MG PO CAPS
100.0000 mg | ORAL_CAPSULE | Freq: Three times a day (TID) | ORAL | Status: DC
  Administered 2019-08-30 – 2019-08-31 (×2): 100 mg via ORAL
  Filled 2019-08-30 (×3): qty 1

## 2019-08-30 MED ORDER — PUREFLOW DIALYSIS SOLUTION
INTRAVENOUS | Status: DC
  Administered 2019-08-31 (×2): 3 via INTRAVENOUS_CENTRAL

## 2019-08-30 MED ORDER — ACETAMINOPHEN 325 MG PO TABS: 650.0000 mg | ORAL_TABLET | Freq: Four times a day (QID) | ORAL | Status: DC | PRN

## 2019-08-30 MED ORDER — ASPIRIN EC 81 MG PO TBEC
81.0000 mg | DELAYED_RELEASE_TABLET | Freq: Every day | ORAL | Status: DC
  Administered 2019-08-30 – 2019-08-31 (×2): 81 mg via ORAL
  Filled 2019-08-30 (×2): qty 1

## 2019-08-30 MED ORDER — ONDANSETRON HCL 4 MG PO TABS: 4.0000 mg | ORAL_TABLET | Freq: Four times a day (QID) | ORAL | Status: DC | PRN

## 2019-08-30 MED ORDER — NOREPINEPHRINE 4 MG/250ML-% IV SOLN: 0.0000 ug/min | INTRAVENOUS | Status: DC

## 2019-08-30 MED ORDER — HEPARIN SODIUM (PORCINE) 5000 UNIT/ML IJ SOLN
5000.0000 [IU] | Freq: Three times a day (TID) | INTRAMUSCULAR | Status: DC
  Administered 2019-08-30: 23:00:00 5000 [IU] via SUBCUTANEOUS
  Filled 2019-08-30: qty 1

## 2019-08-30 MED ORDER — INSULIN ASPART 100 UNIT/ML ~~LOC~~ SOLN: 0.0000 [IU] | Freq: Every day | SUBCUTANEOUS | Status: DC

## 2019-08-30 MED ORDER — SODIUM CHLORIDE 0.9 % IV SOLN
1.0000 g | Freq: Once | INTRAVENOUS | Status: DC
  Filled 2019-08-30: qty 10

## 2019-08-30 MED ORDER — ACETAMINOPHEN 650 MG RE SUPP: 650.0000 mg | Freq: Four times a day (QID) | RECTAL | Status: DC | PRN

## 2019-08-30 MED ORDER — DEXTROSE 50 % IV SOLN
1.0000 | Freq: Once | INTRAVENOUS | Status: AC
  Administered 2019-08-30: 18:00:00 50 mL via INTRAVENOUS
  Filled 2019-08-30: qty 50

## 2019-08-30 MED ORDER — MIDODRINE HCL 5 MG PO TABS
10.0000 mg | ORAL_TABLET | Freq: Once | ORAL | Status: AC
  Administered 2019-08-30: 18:00:00 10 mg via ORAL
  Filled 2019-08-30: qty 2

## 2019-08-30 MED ORDER — PATIROMER SORBITEX CALCIUM 8.4 G PO PACK
16.8000 g | PACK | Freq: Every day | ORAL | Status: DC
  Administered 2019-08-30 – 2019-08-31 (×2): 16.8 g via ORAL
  Filled 2019-08-30 (×2): qty 2

## 2019-08-30 MED ORDER — SODIUM CHLORIDE 0.9 % IV BOLUS
250.0000 mL | Freq: Once | INTRAVENOUS | Status: AC
  Administered 2019-08-30: 15:00:00 250 mL via INTRAVENOUS

## 2019-08-30 MED ORDER — ACETAMINOPHEN 325 MG PO TABS
650.0000 mg | ORAL_TABLET | Freq: Once | ORAL | Status: AC
  Administered 2019-08-30: 18:00:00 650 mg via ORAL
  Filled 2019-08-30: qty 2

## 2019-08-30 MED ORDER — ONDANSETRON HCL 4 MG/2ML IJ SOLN: 4.0000 mg | Freq: Four times a day (QID) | INTRAMUSCULAR | Status: DC | PRN

## 2019-08-30 MED ORDER — MIDODRINE HCL 5 MG PO TABS
10.0000 mg | ORAL_TABLET | Freq: Three times a day (TID) | ORAL | Status: DC
  Administered 2019-08-31 (×3): 10 mg via ORAL
  Filled 2019-08-30 (×3): qty 2

## 2019-08-30 MED ORDER — CHLORHEXIDINE GLUCONATE CLOTH 2 % EX PADS
6.0000 | MEDICATED_PAD | Freq: Every day | CUTANEOUS | Status: DC
  Administered 2019-08-30 – 2019-08-31 (×2): 6 via TOPICAL

## 2019-08-30 NOTE — ED Notes (Signed)
Attempted to call report

## 2019-08-30 NOTE — ED Provider Notes (Signed)
[facility] Emergency Department Provider Note  ____________________________________________   First MD Initiated Contact with Patient 08/30/19 1357     (approximate)  I have reviewed the triage vital signs and the nursing notes.   HISTORY  Chief Complaint Hypotension    HPI Theodore Nguyen is a 73 y.o. male with diabetes, hypertension, hyperlipidemia, end-stage renal disease on dialysis who became very hypotensive and weak.  Patient was previously positive for coronavirus on the seventh or 8 September.  Patient was just discharged from [facility].  Patient went to dialysis today and before treatment they noted he was hypotensive therefore they brought him to the emergency room.  He was last dialyzed on Saturday, 3 days ago.  He endorses some shortness of breath that is mild, constant, nothing makes better, nothing makes it worse.  Denies any fevers, diarrhea, abdominal pain, chest pain.           Past Medical History:  Diagnosis Date   Diabetes mellitus without complication (HCC)    High cholesterol    Hyperlipidemia    Hypertension    Iron deficiency anemia    Renal disorder    dialysis    Patient Active Problem List   Diagnosis Date Noted   Pleural effusion on left 08/21/2019   Pressure injury of skin 08/17/2019   Volume overload 08/16/2019   ESRD (end stage renal disease) ([place]) 08/16/2019   Essential hypertension 08/16/2019   Type 2 diabetes mellitus with chronic kidney disease on chronic dialysis ([place]) 08/16/2019   Anemia due to chronic kidney disease 08/16/2019   COVID-19 virus infection 08/16/2019   Chest pain 01/28/2017    Past Surgical History:  Procedure Laterality Date   INSERTION OF DIALYSIS CATHETER     IR THORACENTESIS ASP PLEURAL SPACE W/IMG GUIDE  08/17/2019   left hand surgery     right knee repair      Prior to Admission medications   Medication Sig Start Date End Date Taking? Authorizing Provider    acetaminophen (TYLENOL 8 HOUR) 650 MG CR tablet Take 650 mg by mouth every 6 (six) hours.    [provider]  amLODipine (NORVASC) 5 MG tablet Take 5 mg by mouth daily.    [provider]  aspirin EC 81 MG tablet Take 81 mg by mouth daily.    [provider]  atorvastatin (LIPITOR) 40 MG tablet Take 40 mg by mouth daily.    [provider]  b complex-vitamin c-folic acid (NEPHRO-VITE) 0.8 MG TABS tablet Take 1 tablet by mouth every morning.     [provider]  carvedilol (COREG) 6.25 MG tablet Take 6.25 mg by mouth daily.    [provider]  Cholecalciferol (VITAMIN D3) 50 MCG (2000 UT) TABS Take 2,000 Units by mouth daily.    [provider]  gabapentin (NEURONTIN) 100 MG capsule Take 100 mg by mouth 3 (three) times daily.    [provider]  guaiFENesin-dextromethorphan (ROBITUSSIN DM) 100-10 MG/5ML syrup Take 5 mLs by mouth every 6 (six) hours as needed for cough. 08/21/19   Arrien, Jimmy Picket, MD  isosorbide mononitrate (IMDUR) 30 MG 24 hr tablet Take 30 mg by mouth daily. HOLD FOR SYST9OLIC LEVELS [id number]    [provider]  Multiple Vitamin (MULTIVITAMIN WITH MINERALS) TABS tablet Take 1 tablet by mouth daily.    [provider]  Nutritional Supplements (FEEDING SUPPLEMENT, NEPRO CARB STEADY,) LIQD Take 237 mLs by mouth 2 (two) times daily between meals. 08/21/19  09/20/19  Arrien, Jimmy Picket, MD  omeprazole (PRILOSEC) 40 MG capsule Take 40 mg by mouth 2 (two) times daily before a meal.     [provider]  ondansetron (ZOFRAN) 4 MG tablet Take 4 mg by mouth every 8 (eight) hours as needed for nausea or vomiting.    [provider]  sertraline (ZOLOFT) 25 MG tablet Take 25 mg by mouth daily.    [provider]    Allergies Patient has no known allergies.  Family History  Problem Relation Age of Onset   Cancer Mother     Social History Social History   Tobacco  Use   Smoking status: Never Smoker   Smokeless tobacco: Never Used  Substance Use Topics   Alcohol use: No   Drug use: No      Review of Systems Constitutional: No fever/chills Eyes: No visual changes. ENT: No sore throat. Cardiovascular: Denies chest pain. Respiratory: + SOB. Gastrointestinal: No abdominal pain.  No nausea, no vomiting.  No diarrhea.  No constipation. Genitourinary: Negative for dysuria. Musculoskeletal: Negative for back pain. Skin: Negative for rash. Neurological: Negative for headaches, focal weakness or numbness. All other ROS negative ____________________________________________   PHYSICAL EXAM:  VITAL SIGNS: ED Triage Vitals  Enc Vitals Group     BP 08/30/19 1349 (!) 74/49     Pulse Rate 08/30/19 1349 67     Resp 08/30/19 1349 16     Temp --      Temp src --      SpO2 08/30/19 1349 100 %     Weight 08/30/19 1353 216 lb 14.9 oz (98.4 kg)     Height 08/30/19 1353 6' (1.829 m)     Head Circumference --      Peak Flow --      Pain Score 08/30/19 1352 6     Pain Loc --      Pain Edu? --      Excl. in [place]? --     Constitutional: Alert and oriented. Well appearing and in no acute distress. Eyes: Conjunctivae are normal. EOMI. Head: Atraumatic. Nose: No congestion/rhinnorhea. Mouth/Throat: Mucous membranes are moist.   Neck: No stridor. Trachea Midline. FROM Cardiovascular: Normal rate, regular rhythm. Grossly normal heart sounds.  Good peripheral circulation. Respiratory: Normal respiratory effort.  No retractions. Decreased breath sounds on the L.  2L [place]  Gastrointestinal: Soft and nontender. No distention. No abdominal bruits.  Musculoskeletal: R arm fistula.  L arm larger (baseline per patient)  No joint effusions. Neurologic:  Normal speech and language. No gross focal neurologic deficits are appreciated.  Skin:  Skin is warm, dry and intact. No rash noted. Psychiatric: Mood and affect are normal. Speech and behavior are normal. GU:  Deferred   ____________________________________________   LABS (all labs ordered are listed, but only abnormal results are displayed)  Labs Reviewed  SARS CORONAVIRUS 2 ([facility]) - Abnormal; Notable for the following components:      Result Value   SARS Coronavirus 2 POSITIVE (*)    All other components within normal limits  CBC WITH DIFFERENTIAL/PLATELET - Abnormal; Notable for the following components:   RBC 3.19 (*)    Hemoglobin 8.5 (*)    HCT 29.4 (*)    MCHC 28.9 (*)    RDW 19.2 (*)    Lymphs Abs 0.3 (*)    All other components within normal limits  BASIC METABOLIC PANEL - Abnormal; Notable for the following  components:   Potassium 6.0 (*)    BUN 68 (*)    Creatinine, Ser 5.19 (*)    Calcium 8.7 (*)    GFR calc non Af Amer 10 (*)    GFR calc Af Amer 12 (*)    All other components within normal limits  HEPATIC FUNCTION PANEL - Abnormal; Notable for the following components:   Total Protein 5.9 (*)    Albumin 2.2 (*)    AST 11 (*)    All other components within normal limits  LACTIC ACID, PLASMA  LACTIC ACID, PLASMA   ____________________________________________   ED ECG REPORT I, Vanessa English, the attending physician, personally viewed and interpreted this ECG.  EKG is sinus rate of 67, left bundle branch block, negative scar Bosa criteria, normal intervals otherwise.  EKG otherwise looks similar to prior. ____________________________________________  [place], personally viewed and evaluated these images (plain radiographs) as part of my medical decision making, as well as reviewing the written report by the radiologist.  ED MD interpretation: X-ray shows a moderate size left pleural effusion with cardiomegaly.  Official radiology report(s): Dg Chest Portable 1 View  Result Date: 08/30/2019 CLINICAL DATA:  Shortness of breath. Renal failure. Recent COVID-19 positive EXAM: PORTABLE CHEST 1 VIEW  COMPARISON:  August 17, 2019 FINDINGS: There is cardiomegaly with pulmonary venous hypertension. There are pleural effusions bilaterally, significantly larger on the left than on the right. Pleural effusion appears at least partially loculated on the left. There is patchy airspace opacity throughout the right lower lung zone and in the left upper lobe, new in the right base and increased in the left upper lobe. There is consolidation in the left base medially. No adenopathy.  There is aortic atherosclerosis.  No bone lesions. IMPRESSION: Suspect multifocal pneumonia superimposed on pulmonary vascular congestion and likely congestive heart failure. It is possible that all of the changes present are due to congestive heart failure, although the appearance is more suggestive of a combination of pneumonia and edema. No adenopathy. Aortic Atherosclerosis (ICD10-I70.0). Electronically Signed   By: Lowella Grip III M.D.   On: 08/30/2019 15:07    ____________________________________________   PROCEDURES  Procedure(s) performed (including Critical Care):  .Critical Care Performed by: Vanessa [place], MD Authorized by: Vanessa [place], MD   Critical care provider statement:    Critical care time (minutes):  45   Critical care was necessary to treat or prevent imminent or life-threatening deterioration of the following conditions:  Respiratory failure and endocrine crisis   Critical care was time spent personally by me on the following activities:  Discussions with consultants, evaluation of patient's response to treatment, examination of patient, ordering and performing treatments and interventions, ordering and review of laboratory studies, ordering and review of radiographic studies, pulse oximetry, re-evaluation of patient's condition, obtaining history from patient or surrogate and review of old charts Ultrasound ED Peripheral IV (Provider)  Date/Time: 08/30/2019 5:04 PM Performed by: Vanessa [place],  MD Authorized by: Vanessa , MD   Procedure details:    Indications: poor IV access     Skin Prep: chlorhexidine gluconate     Location:  Right forearm   Angiocath:  20 G   Bedside Ultrasound Guided: Yes     Images: not archived     Patient tolerated procedure without complications: Yes     Dressing applied: Yes       ____________________________________________   INITIAL IMPRESSION / ASSESSMENT AND PLAN / ED  COURSE  Theodore Nguyen was evaluated in Emergency Department on 08/30/2019 for the symptoms described in the history of present illness. He was evaluated in the context of the global COVID-19 pandemic, which necessitated consideration that the patient might be at risk for infection with the SARS-CoV-2 virus that causes COVID-19. Institutional protocols and algorithms that pertain to the evaluation of patients at risk for COVID-19 are in a state of rapid change based on information released by regulatory bodies including the CDC and federal and state organizations. These policies and algorithms were followed during the patient's care in the ED.    Patient is a 73 year old with ESRD who presents with shortness of breath and hypotension.  Patient is mentating well.  Chest x-ray does show moderate sized pleural effusion.  Appears the patient required a thoracentesis in the past for this effusion.  Will give 500 cc fluid given BSUS does show a compressible IVC without evidence of pericardial effusion.  Although blood pressures are low patient is mentating well.  Will check a potassium level given his missed dialysis.  Denies any fevers to suggest bacteremia.  Patient did recently get discharged for coronavirus so we will retest him for COVID.  No abdominal pain to suggest abdominal infection.  Patient's K is 6.0.  Will give 5 insulin and bicarb.  3:35 PM lactate is normal making it reassuring that patient's blood pressure although low is not causing hypoperfusion.  Patient does not  have any lightheadedness.  On review of records patient typically runs in the 0000000 to [id number] systolic.  I discussed with the nephrologist Dr. Holley Raring he recommended be giving Valtassa to help limit K as well as 10 mg of Midrin.  Once covid test is back we will decide on plan for him.  Covid+  D/w Dr. Doristine Bosworth from [facility] health recommend ICU bed.   D/w [facility] ICU and they are unable to get ICU bed there.  Given there is no ICU beds at [facility] health will discuss with our ICU team for admission there.  D/w Dr. Holley Raring- will dialyze in unit in a few hours.   D/w Our ICU doctor and they will keep pt here given [facility] has no ICU beds.     ____________________________________________   FINAL CLINICAL IMPRESSION(S) / ED DIAGNOSES   Final diagnoses:  Pleural effusion  COVID-19  Hyperkalemia  ESRD (end stage renal disease) ([place])      MEDICATIONS GIVEN DURING THIS VISIT:  Medications  patiromer (VELTASSA) packet 16.8 g (16.8 g Oral Given 08/30/19 1826)  sodium chloride 0.9 % bolus 250 mL (0 mLs Intravenous Stopped 08/30/19 1631)  calcium gluconate 1 g/ 50 mL sodium chloride IVPB (0 g Intravenous Stopped 08/30/19 1643)  midodrine (PROAMATINE) tablet 10 mg (10 mg Oral Given 08/30/19 1741)  sodium chloride 0.9 % bolus 250 mL (250 mLs Intravenous New Bag/Given 08/30/19 1644)  sodium bicarbonate injection 50 mEq (50 mEq Intravenous Given 08/30/19 1817)  insulin aspart (novoLOG) injection 5 Units (5 Units Intravenous Given 08/30/19 1756)    And  dextrose 50 % solution 50 mL (50 mLs Intravenous Given 08/30/19 1757)  acetaminophen (TYLENOL) tablet 650 mg (650 mg Oral Given 08/30/19 1740)     ED Discharge Orders    None       Note:  This document was prepared using Dragon voice recognition software and may include unintentional dictation errors.   Vanessa [place], MD 08/30/19 [id number]

## 2019-08-30 NOTE — ED Notes (Signed)
[facility]  CALLED FOR  TRANSFER  PER  DR  Jari Pigg  MD

## 2019-08-30 NOTE — Consult Note (Addendum)
PCCM ATTENDING Addendum:  I have evaluated patient with the APP, I personally  reviewed database in its entirety and discussed care plan in detail. In addition, this patient was discussed on multidisciplinary rounds.   Important exam findings:  Patient noted by RN to be less responsive and patient was not interactive.  She was unable to feel pulse and charge nurse also was unable to feel pulse.  Vitals showed HR 40s with PEA.  ACLS was started with 1 epi given and ROSC achieved. Patient intubated during 4 minutes of CPR.  Stat CXR, ABG, repeat blood work immediately ordered and in progress.    On vent -  ABG reviewed with resp acidosis, settings adjusted to increase minute ventilation.  Reg, no Murmurs NT.ND, soft +BS LE edema +     Major problems addressed by PCCM team: Acute respiratory failure with hypotension S/p PEA arrest   PLAN/REC: Continue bronchodilators Continue CVVH, heparin gtt Continue to monitor in ICU/SDU Need to address goals of care   Critical Care Time devoted to patient care services described in this note is 109 minutes.   Overall, patient is critically ill, prognosis is guarded.  Patient with Multiorgan failure and at high risk for cardiac arrest and death.     Theodore Nguyen, M.D.  Pulmonary & [place]           Name: Theodore Nguyen MRN: [id number] DOB: 05-Sep-1946    ADMISSION DATE:  08/30/2019 CONSULTATION DATE:  08/30/2019  REFERRING MD :  Dr. Brett Albino  CHIEF COMPLAINT:  Hypotension, Shortness of Breath  BRIEF PATIENT DESCRIPTION:  73 y.o. Male with PMH of ESRD on HD, HTN, HLD admitted on 9/22 with Hypotension and Acute Hypoxic Respiratory Failure secondary to COVID-19 infection,? Multifocal Pneumonia, Pulmonary Edema, & Bilateral Pleural Effusions.  Nephrology is consulted, and he is to undergo CRRT.  He was recently treated for COVID-19 infection and [place] (9/8-9/18), he completed 5 days of  Remdesivir IV and Dexamethasone IV.  He was discharged to SNF on 9/18.  SIGNIFICANT EVENTS  9/22>> Admission to Stepdown 9/23>> Left Femoral Trialysis HD catheter placed 9/23>> Placed on CRRT  STUDIES:  Venous Ultrasound LUE 9/23>>No evidence of DVT within the left upper extremity.   CULTURES: SARS-CoV-2 PCR 9/22>> POSITIVE MRSA PCR 9/22>> Sputum 9/22>> Strep Pneumo urinary antigen 9/22>> Legionella urinary antigen 9/22>> Blood x2  9/23>>  ANTIBIOTICS: N/A  HISTORY OF PRESENT ILLNESS:   Theodore Nguyen is a 73 year old male with a past medical history notable for end-stage renal disease on hemodialysis, hypertension, hyperlipidemia, type 2 diabetes, iron deficiency anemia, and recent COVID-19 infection with hospitalization at [facility] from 9/8-9/18 who presents to [facility] ED on 08/30/2019 from outpatient hemodialysis with complaints of hypotension, shortness of breath, and cough.  Upon presentation to ED his systolic blood pressures were in the 70s to 80s.  He denied chest pain, fever, chills, abdominal pain.  Initial work-up in the ED revealed potassium 6.0, creatinine 5.19, lactic acid 0.7, WBC 6.5 with lymphocytopenia, hemoglobin 8.5.  His repeat COVID-19 PCR is positive.  Chest x-ray with multifocal pneumonia with superimposed CHF and bilateral pulmonary effusions (left greater than right). He was given 500 cc fluid bolus, midodrine, and was placed on Levophed infusion.  For his hyperkalemia, he was given insulin, bicarb, calcium gluconate, and valtessa. Nephrology has been consulted for Hemodialysis.  Attempt was made to transfer him to [facility], but there were no ICU beds available.  He is  being admitted to stepdown unit for further work-up and treatment of hypotension and acute hypoxic respiratory failure in setting of COVID-19 infection and pulmonary edema, pleural effusions, and suspected multifocal pneumonia.  PCCM is consulted for further management.  Of note, during his recent  hospitalizaition at [facility] for COVID-19 infection 9/8-9/18, he completed 5 days of Remdesivir IV and Dexamethasone IV.  He was discharged to SNF on 9/18.    PAST MEDICAL HISTORY :   has a past medical history of COVID-19 virus infection, Diabetes mellitus without complication ([place]), High cholesterol, Hyperlipidemia, Hypertension, Iron deficiency anemia, and Renal disorder.  has a past surgical history that includes right knee repair; left hand surgery; Insertion of dialysis catheter; IR THORACENTESIS ASP PLEURAL SPACE W/IMG GUIDE (08/17/2019); and Brain surgery. Prior to Admission medications   Medication Sig Start Date End Date Taking? Authorizing Provider  amLODipine (NORVASC) 5 MG tablet Take 5 mg by mouth daily.   Yes [provider]  aspirin EC 81 MG tablet Take 81 mg by mouth daily.   Yes [provider]  atorvastatin (LIPITOR) 40 MG tablet Take 40 mg by mouth daily.   Yes [provider]  b complex-vitamin c-folic acid (NEPHRO-VITE) 0.8 MG TABS tablet Take 1 tablet by mouth every morning.    Yes [provider]  carvedilol (COREG) 6.25 MG tablet Take 6.25 mg by mouth daily.   Yes [provider]  Cholecalciferol (VITAMIN D3) 50 MCG (2000 UT) TABS Take 2,000 Units by mouth daily.   Yes [provider]  gabapentin (NEURONTIN) 100 MG capsule Take 100 mg by mouth 3 (three) times daily.   Yes [provider]  guaiFENesin-dextromethorphan (ROBITUSSIN DM) 100-10 MG/5ML syrup Take 5 mLs by mouth every 6 (six) hours as needed for cough. 08/21/19  Yes Arrien, Jimmy Picket, MD  isosorbide mononitrate (IMDUR) 30 MG 24 hr tablet Take 30 mg by mouth daily. HOLD FOR SYST9OLIC LEVELS [id number]   Yes [provider]  Multiple Vitamin (MULTIVITAMIN WITH MINERALS) TABS tablet Take 1 tablet by mouth daily.   Yes [provider]  omeprazole (PRILOSEC) 40 MG capsule Take 40 mg by mouth 2 (two) times daily before a meal.    Yes  [provider]  sertraline (ZOLOFT) 25 MG tablet Take 25 mg by mouth daily.   Yes [provider]  acetaminophen (TYLENOL 8 HOUR) 650 MG CR tablet Take 650 mg by mouth every 6 (six) hours.    [provider]  Nutritional Supplements (FEEDING SUPPLEMENT, NEPRO CARB STEADY,) LIQD Take 237 mLs by mouth 2 (two) times daily between meals. 08/21/19 09/20/19  Arrien, Jimmy Picket, MD  ondansetron (ZOFRAN) 4 MG tablet Take 4 mg by mouth every 8 (eight) hours as needed for nausea or vomiting.    [provider]   No Known Allergies  FAMILY HISTORY:  family history includes Cancer in his mother. SOCIAL HISTORY:  reports that he has never smoked. He has never used smokeless tobacco. He reports that he does not drink alcohol or use drugs.   COVID-19 DISASTER DECLARATION:  FULL CONTACT PHYSICAL EXAMINATION WAS NOT POSSIBLE DUE TO TREATMENT OF COVID-19 AND  CONSERVATION OF PERSONAL PROTECTIVE EQUIPMENT, LIMITED EXAM FINDINGS INCLUDE-  Patient assessed or the symptoms described in the history of present illness.  In the context of the Global COVID-19 pandemic, which necessitated consideration that the patient might be at risk for infection with the SARS-CoV-2 virus that causes COVID-19, Institutional protocols and algorithms that pertain to  the evaluation of patients at risk for COVID-19 are in a state of rapid change based on information released by regulatory bodies including the CDC and federal and state organizations. These policies and algorithms were followed during the patient's care while in hospital.  REVIEW OF SYSTEMS:  Positives in BOLD Constitutional: Negative for fever, chills, weight loss, malaise/fatigue and diaphoresis, + generalized weakness HENT: Negative for hearing loss, ear pain, nosebleeds, congestion, sore throat, neck pain, tinnitus and ear discharge.   Eyes: Negative for blurred vision, double vision, photophobia, pain, discharge and  redness.  Respiratory: Negative for +cough, hemoptysis, +sputum production, +shortness of breath, wheezing and stridor.   Cardiovascular: Negative for chest pain, palpitations, orthopnea, claudication, leg swelling and PND.  Gastrointestinal: Negative for heartburn, nausea, vomiting, abdominal pain, diarrhea, constipation, blood in stool and melena.  Genitourinary: Negative for dysuria, urgency, frequency, hematuria and flank pain.  Musculoskeletal: Negative for myalgias, back pain, joint pain and falls.  Skin: Negative for itching and rash.  Neurological: Negative for dizziness, tingling, tremors, sensory change, speech change, focal weakness, seizures, loss of consciousness, weakness and headaches.  Endo/Heme/Allergies: Negative for environmental allergies and polydipsia. Does not bruise/bleed easily.  SUBJECTIVE:  Reports mild shortness of breath, cough, sputum production (unble to state color of sputum) Denies chest pain, abdominal pain, N/V, Fever, chills On 2L [place] On 4 mcg Levophed  VITAL SIGNS: Pulse Rate:  [63-71] 63 (09/22 1930) Resp:  [11-17] 15 (09/22 1930) BP: (70-91)/(47-62) 78/49 (09/22 1930) SpO2:  [92 %-100 %] 97 % (09/22 1930) Weight:  [98.4 kg] 98.4 kg (09/22 1353)  PHYSICAL EXAMINATION: General:  Acute on chronically ill appearing male, laying in bed, on 2L [place], in no acute distress Neuro:  Awake, A&O x3 (disoriented to situation), Follows commands, no focal deficits HEENT:  Atraumatic, normocephalic, neck supple, no JVD, Pupils PERRLA Cardiovascular:  Regular rate & rhythm Lungs:  Unable to auscultate due to PPE; even, nonlabored, normal effort Abdomen:  Obese, soft, nontender, nondistended, no guarding or rebound tenderness Musculoskeletal:  Generalized weakness, no deformities, no pedal edema Skin:  Warm and dry, no obvious rashes, lesions, or ulcerations  Recent Labs  Lab 08/25/19 0448 08/26/19 0449 08/30/19 1409  NA 137 137 139  K 5.7* 4.6 6.0*  CL 101  102 101  CO2 23 24 26   BUN 71* 45* 68*  CREATININE 5.03* 3.81* 5.19*  GLUCOSE 94 111* 92   Recent Labs  Lab 08/25/19 1512 08/30/19 1409  HGB 9.8* 8.5*  HCT 32.2* 29.4*  WBC 6.9 6.5  PLT 168 173   Dg Chest Portable 1 View  Result Date: 08/30/2019 CLINICAL DATA:  Shortness of breath. Renal failure. Recent COVID-19 positive EXAM: PORTABLE CHEST 1 VIEW COMPARISON:  August 17, 2019 FINDINGS: There is cardiomegaly with pulmonary venous hypertension. There are pleural effusions bilaterally, significantly larger on the left than on the right. Pleural effusion appears at least partially loculated on the left. There is patchy airspace opacity throughout the right lower lung zone and in the left upper lobe, new in the right base and increased in the left upper lobe. There is consolidation in the left base medially. No adenopathy.  There is aortic atherosclerosis.  No bone lesions. IMPRESSION: Suspect multifocal pneumonia superimposed on pulmonary vascular congestion and likely congestive heart failure. It is possible that all of the changes present are due to congestive heart failure, although the appearance is more suggestive of a combination of pneumonia and edema. No adenopathy. Aortic Atherosclerosis (ICD10-I70.0). Electronically Signed  By: Lowella Grip III M.D.   On: 08/30/2019 15:07    ASSESSMENT / PLAN:  Hypotension Hx: HTN, Hyperlipidemia -Continuous cardiac monitoring -Maintain MAP >60 -Received 500 cc bolus in ED; cautious IVF given Pulmonary edema on CXR & ESRD on HD -Continue Midodrine -Levophed if needed to maintain MAP goal -Sepsis workup -Consider Echocardiogram  Acute Hypoxic Respiratory Failure in the setting of COVID-19 Infection, Pulmonary edema, & Bilateral Pleural effusions -Supplemental O2 as needed to maintain O2 sats >88% -Follow intermittent CXR & ABG as needed -Was treated for COVID-19 @ [facility] with 5 days of Remdesivir & Dexamethasone,  discharged 9/18  -CXR 9/22 with ? Multifocal PNA superimposed on CHF and bilateral pleural effusions -Check Procalcitonin, Sputum culture, Strep pneumo urinary antigen & Legionella urinary antigen to r/o secondary bacterial infection -Trend Biomarkers:Troponin, CRP, Ferritin, Fibrinogen, D-dimer -Encourage prone positioning (>16 hours day in 2-3 increments) as tolerated -Maintain Euvolemia with HD/CRRT -Consider Thoracentesis  ESRD on HD Hyperkalemia -Monitor I&O's / urinary output -Follow BMP -Ensure adequate renal perfusion -Avoid nephrotoxic agents as able -Replace electrolytes as indicated -Nephrology consulted, appreciate input -HD/CRRT as per Nephrology -Received insulin, 1 amp Bicarb, Calcium Gluconate, Valtessa in ED 9/22  Anemia of Chronic Disease -Monitor for S/Sx of bleeding -Trend CBC -Heparin SQ for VTE Prophylaxis  -Transfuse for Hgb <7  Diabetes Mellitus -CBG's -SSI -Follow ICU Hypo/hyperglycemia protocol         DISPOSITION: Stepdown GOALS OF CARE: Full Code VTE PROPHYLAXIS: Heparin SQ UPDATES: Updated pt at bedside 08/30/2019 and updated pt's daughter Tamari Wehri via telephone.   Darel Hong, AGACNP-BC [facility] Pager: [phone] Cell: [phone]  08/30/2019, 7:59 PM

## 2019-08-30 NOTE — ED Notes (Signed)
Messaged MD regarding pt's BP lower than previous BP checks. PT does not appear symptomatic. PT is alert.  Awaiting orders.

## 2019-08-30 NOTE — Progress Notes (Signed)
AdMr. Theodore Nguyen is a 73 year old male with past medical history of diabetes, hypertension, hyperlipidemia, end-stage renal disease on dialysis presented to [facility] emergency department due to weakness and shortness of breath. He went for dialysis today and before the treatment they noted he was hypotensive therefore they brought him to the emergency department for further evaluation and management.    His COVID-19 test came back positive.  Chest x-ray shows moderate-sized pleural effusion. BMP: Elevated potassium at 6.  Calcium gluconate, 5 units of regular insulin and sodium bicarb was given.   500 cc of IV fluid bolus and midodrine 10 mg p.o. once given for hypotension.  His blood pressure remained in 70s over 40s.  I discussed with the ED physician Dr. Johny Drilling patient might need IV pressors in ICU as his blood pressure is still in 70s after receiving IV fluid bolus.  We both agreed with the plan.

## 2019-08-30 NOTE — H&P (Signed)
[place] [facility] [place] NAME: Theodore Nguyen    MR#:  [id number]  DATE OF BIRTH:  October 23, 1946  DATE OF ADMISSION:  08/30/2019  PRIMARY CARE PHYSICIAN: Eber Hong, MD   REQUESTING/REFERRING PHYSICIAN: Marjean Donna, MD  CHIEF COMPLAINT:   Chief Complaint  Patient presents with  . Hypotension    HISTORY OF PRESENT ILLNESS:  Theodore Nguyen  is a 73 y.o. male with a known history of recent COVID infection hospitalization, hypertension, type 2 diabetes, hyperlipidemia, iron deficiency anemia, ESRD on HD who was sent to the ED from the dialysis center due to hypotension.  That he was feeling very weak at dialysis, but feels a little bit better now.  His blood pressures were in the [id number] systolics.  Patient endorses some shortness of breath and cough.  He is unsure if his cough is productive.  He denies any fevers, chills, chest pain, or abdominal pain.  Patient was recently hospitalized at [facility] from 9/8-9/18 with COVID-19 infection.  Hospitalization, he was noted to have a large left pleural effusion and underwent left thoracentesis with 1000 mL removed.  Patient was discharged back to SNF in improved condition.  In the ED, BP was 74/49.  MAPs were in the high 50s-low 60s.  He was placed on 2 liters O2 for comfort.  Labs were significant for potassium 6.0, hemoglobin 8.5.  Repeat COVID testing was positive.  Chest x-ray showed multifocal pneumonia superimposed on pulmonary vascular congestion and likely congestive heart failure.  Physician attempted to transfer him to [facility] for further management, but there were no ICU beds available.  Hospitalists were called for admission.  PAST MEDICAL HISTORY:   Past Medical History:  Diagnosis Date  . COVID-19 virus infection   . Diabetes mellitus without complication ([place])   . High cholesterol   . Hyperlipidemia   . Hypertension   . Iron deficiency anemia   . Renal disorder    dialysis     PAST SURGICAL HISTORY:   Past Surgical History:  Procedure Laterality Date  . BRAIN SURGERY    . INSERTION OF DIALYSIS CATHETER    . IR THORACENTESIS ASP PLEURAL SPACE W/IMG GUIDE  08/17/2019  . left hand surgery    . right knee repair      SOCIAL HISTORY:   Social History   Tobacco Use  . Smoking status: Never Smoker  . Smokeless tobacco: Never Used  Substance Use Topics  . Alcohol use: No    FAMILY HISTORY:   Family History  Problem Relation Age of Onset  . Cancer Mother     DRUG ALLERGIES:  No Known Allergies  REVIEW OF SYSTEMS:   Review of Systems  Constitutional: Negative for chills and fever.  HENT: Negative for congestion and sore throat.   Eyes: Negative for blurred vision and double vision.  Respiratory: Positive for cough and shortness of breath.   Cardiovascular: Positive for leg swelling. Negative for chest pain and palpitations.  Gastrointestinal: Negative for nausea and vomiting.  Genitourinary: Negative for dysuria and urgency.  Musculoskeletal: Negative for back pain and neck pain.  Neurological: Positive for weakness. Negative for dizziness, focal weakness and headaches.  Psychiatric/Behavioral: Negative for depression. The patient is not nervous/anxious.     MEDICATIONS AT HOME:   Prior to Admission medications   Medication Sig Start Date End Date Taking? Authorizing Provider  amLODipine (NORVASC) 5 MG tablet Take 5 mg by mouth daily.   Yes  [provider]  aspirin EC 81 MG tablet Take 81 mg by mouth daily.   Yes [provider]  atorvastatin (LIPITOR) 40 MG tablet Take 40 mg by mouth daily.   Yes [provider]  b complex-vitamin c-folic acid (NEPHRO-VITE) 0.8 MG TABS tablet Take 1 tablet by mouth every morning.    Yes [provider]  carvedilol (COREG) 6.25 MG tablet Take 6.25 mg by mouth daily.   Yes [provider]  Cholecalciferol (VITAMIN D3) 50 MCG (2000 UT) TABS Take 2,000 Units by  mouth daily.   Yes [provider]  gabapentin (NEURONTIN) 100 MG capsule Take 100 mg by mouth 3 (three) times daily.   Yes [provider]  guaiFENesin-dextromethorphan (ROBITUSSIN DM) 100-10 MG/5ML syrup Take 5 mLs by mouth every 6 (six) hours as needed for cough. 08/21/19  Yes Arrien, Jimmy Picket, MD  isosorbide mononitrate (IMDUR) 30 MG 24 hr tablet Take 30 mg by mouth daily. HOLD FOR SYST9OLIC LEVELS [id number]   Yes [provider]  Multiple Vitamin (MULTIVITAMIN WITH MINERALS) TABS tablet Take 1 tablet by mouth daily.   Yes [provider]  omeprazole (PRILOSEC) 40 MG capsule Take 40 mg by mouth 2 (two) times daily before a meal.    Yes [provider]  sertraline (ZOLOFT) 25 MG tablet Take 25 mg by mouth daily.   Yes [provider]  acetaminophen (TYLENOL 8 HOUR) 650 MG CR tablet Take 650 mg by mouth every 6 (six) hours.    [provider]  Nutritional Supplements (FEEDING SUPPLEMENT, NEPRO CARB STEADY,) LIQD Take 237 mLs by mouth 2 (two) times daily between meals. 08/21/19 09/20/19  Arrien, Jimmy Picket, MD  ondansetron (ZOFRAN) 4 MG tablet Take 4 mg by mouth every 8 (eight) hours as needed for nausea or vomiting.    [provider]      VITAL SIGNS:  Blood pressure (!) 74/47, pulse 67, resp. rate 17, height 6' (1.829 m), weight 98.4 kg, SpO2 94 %.  PHYSICAL EXAMINATION:  Physical Exam  GENERAL:  73 y.o.-year-old patient lying in the bed with no acute distress.  EYES: Pupils equal, round, reactive to light and accommodation. No scleral icterus. Extraocular muscles intact.  HEENT: Head atraumatic, normocephalic. Oropharynx and nasopharynx clear.  NECK:  Supple, no jugular venous distention. No thyroid enlargement, no tenderness.  LUNGS: + Diminished breath sounds in the lung bases bilaterally.  Scattered rhonchi present throughout all lung fields.  Nasal cannula in place. No use of accessory muscles of respiration.   CARDIOVASCULAR: RRR, S1, S2 normal. No murmurs, rubs, or gallops.  ABDOMEN: Soft, nontender, nondistended. Bowel sounds present. No organomegaly or mass.  EXTREMITIES: No pedal edema, cyanosis, or clubbing.  NEUROLOGIC: Cranial nerves II through XII are intact. + Global weakness. Sensation intact. Gait not checked.  PSYCHIATRIC: The patient is alert and oriented x 3.  SKIN: No obvious rash, lesion, or ulcer.   LABORATORY PANEL:   CBC Recent Labs  Lab 08/30/19 1409  WBC 6.5  HGB 8.5*  HCT 29.4*  PLT 173   ------------------------------------------------------------------------------------------------------------------  Chemistries  Recent Labs  Lab 08/30/19 1409  NA 139  K 6.0*  CL 101  CO2 26  GLUCOSE 92  BUN 68*  CREATININE 5.19*  CALCIUM 8.7*  AST 11*  ALT 11  ALKPHOS 63  BILITOT 0.8   ------------------------------------------------------------------------------------------------------------------  Cardiac Enzymes No results for input(s): TROPONINI in the last 168 hours. ------------------------------------------------------------------------------------------------------------------  RADIOLOGY:  Dg Chest Portable 1 View  Result Date: 08/30/2019 CLINICAL DATA:  Shortness of breath. Renal failure. Recent COVID-19 positive EXAM: PORTABLE CHEST 1 VIEW COMPARISON:  August 17, 2019 FINDINGS: There is cardiomegaly with pulmonary venous hypertension. There are pleural effusions bilaterally, significantly larger on the left than on the right. Pleural effusion appears at least partially loculated on the left. There is patchy airspace opacity throughout the right lower lung zone and in the left upper lobe, new in the right base and increased in the left upper lobe. There is consolidation in the left base medially. No adenopathy.  There is aortic atherosclerosis.  No bone lesions. IMPRESSION: Suspect multifocal pneumonia superimposed on pulmonary vascular congestion and  likely congestive heart failure. It is possible that all of the changes present are due to congestive heart failure, although the appearance is more suggestive of a combination of pneumonia and edema. No adenopathy. Aortic Atherosclerosis (ICD10-I70.0). Electronically Signed   By: Lowella Grip III M.D.   On: 08/30/2019 15:07      IMPRESSION AND PLAN:   Hypotension with a history of hypertension -Start midodrine 10mg  tid -Holding home norvasc, coreg, imdur -May need to be started on pressors  Acute hypoxic respiratory failure secondary to COVID+ infection and pulmonary edema- CXR with combination of pneumonia and pulmonary edema. Recently hospitalized at [facility] for [place]. -Repeat COVID testing was positive -Check procalcitonin -Can consider repeat thoracentesis if no improvement with dialysis -Will hold off on antibiotics for now, can always start these if he clinically worsens  Hyperkalemia in ESRD on HD -Nephrology consulted- plan for HD tonight -Received a dose of veltassa in the ED, in addition to insulin and calcium gluconate  Type 2 diabetes -SSI  Hyperlipidemia- stable -Continue home lipitor  Depression- stable -Continue home zoloft   Anemia of chronic kidney disease- hemoglobin is a little less than baseline -Monitor -Transfuse for Hgb < 7  All the records are reviewed and case discussed with ED provider. Management plans discussed with the patient, family and they are in agreement.  CODE STATUS: Full  TOTAL TIME TAKING CARE OF THIS PATIENT: 45 minutes.    Berna Spare Jamelle Goldston M.D on 08/30/2019 at 7:00 PM  Between 7am to 6pm - Pager [phone]  After 6pm go to [email or web address] - Proofreader  [facility]  Office  [phone]  CC: Primary care physician; Eber Hong, MD   Note: This dictation was prepared with Dragon dictation along with smaller phrase technology. Any transcriptional errors that result from this process  are unintentional.

## 2019-08-30 NOTE — Progress Notes (Signed)
Family Meeting Note  Advance Directive:no  Today a meeting took place with the Patient.  Patient is able to participate.  The following clinical team members were present during this meeting:MD  The following were discussed:Patient's diagnosis: hypotension, Patient's progosis: Unable to determine and Goals for treatment: Full Code  Additional follow-up to be provided: prn  Time spent during discussion:20 minutes  Evette Doffing, MD

## 2019-08-30 NOTE — ED Notes (Signed)
Paige RN, aware of bed assigned  

## 2019-08-30 NOTE — ED Notes (Signed)
Spoke to Dr. Brett Albino in person regarding pt BP and condition. No new orders at this time.

## 2019-08-30 NOTE — ED Notes (Signed)
Pt stated that he forgot and took his blood pressure medicine prior to dialysis, which he is not supposed to.

## 2019-08-30 NOTE — ED Notes (Signed)
Pt's O2 sat was 92 during triage, placed pt on 2L O2 [place] and sat currently 100%.

## 2019-08-30 NOTE — ED Notes (Signed)
ED TO INPATIENT HANDOFF REPORT  ED Nurse Name and Phone #: Cylus Douville 39  S Name/Age/Gender Theodore Nguyen 73 y.o. male Room/Bed: ED14A/ED14A  Code Status   Code Status: Prior  Home/SNF/Other Home Patient oriented to: self and time Is this baseline? uknown     Chief Complaint hypoenstion  Triage Note Pt arrived via [facility] from dialysis, they called when pt became hypotensive and weak. Per EMS Pt covid positive on the 7th or 8th and just released from [facility]. EMS had pt on 2L O2 for SOB and Pt had diminished lung sounds bilateral lower lobes.    Allergies No Known Allergies  Level of Care/Admitting Diagnosis ED Disposition    ED Disposition Condition [facility] Area: [place] [[id number]]  Level of Care: Stepdown [14]  Covid Evaluation: Confirmed COVID Positive  Diagnosis: Hypotension [id number]  Admitting Physician: Hyman Bible DODD [id number]  Attending Physician: Hyman Bible DODD [id number]  Estimated length of stay: past midnight tomorrow  Certification:: I certify this patient will need inpatient services for at least 2 midnights  PT Class (Do Not Modify): Inpatient [101]  PT Acc Code (Do Not Modify): Private [1]       B Medical/Surgery History Past Medical History:  Diagnosis Date  . COVID-19 virus infection   . Diabetes mellitus without complication ([place])   . High cholesterol   . Hyperlipidemia   . Hypertension   . Iron deficiency anemia   . Renal disorder    dialysis   Past Surgical History:  Procedure Laterality Date  . BRAIN SURGERY    . INSERTION OF DIALYSIS CATHETER    . IR THORACENTESIS ASP PLEURAL SPACE W/IMG GUIDE  08/17/2019  . left hand surgery    . right knee repair       A IV Location/Drains/Wounds Patient Lines/Drains/Airways Status   Active Line/Drains/Airways    Name:   Placement date:   Placement time:   Site:   Days:   Peripheral IV 08/30/19 Left Forearm   08/30/19    1434    Forearm   less  than 1   Fistula / Graft Right Upper arm Arteriovenous fistula   -    -    Upper arm      Pressure Injury 08/16/19 Coccyx Posterior Stage II -  Partial thickness loss of dermis presenting as a shallow open ulcer with a red, pink wound bed without slough.   08/16/19    2215     14          Intake/Output Last 24 hours  Intake/Output Summary (Last 24 hours) at 08/30/2019 1934 Last data filed at 08/30/2019 1842 Gross per 24 hour  Intake 550 ml  Output -  Net 550 ml    Labs/Imaging Results for orders placed or performed during the hospital encounter of 08/30/19 (from the past 48 hour(s))  CBC with Differential     Status: Abnormal   Collection Time: 08/30/19  2:09 PM  Result Value Ref Range   WBC 6.5 4.0 - 10.5 K/uL   RBC 3.19 (L) 4.22 - 5.81 MIL/uL   Hemoglobin 8.5 (L) 13.0 - 17.0 g/dL   HCT 29.4 (L) 39.0 - 52.0 %   MCV 92.2 80.0 - 100.0 fL   MCH 26.6 26.0 - 34.0 pg   MCHC 28.9 (L) 30.0 - 36.0 g/dL   RDW 19.2 (H) 11.5 - 15.5 %   Platelets 173 150 - 400 K/uL   nRBC 0.0 0.0 -  0.2 %   Neutrophils Relative % 87 %   Neutro Abs 5.8 1.7 - 7.7 K/uL   Lymphocytes Relative 4 %   Lymphs Abs 0.3 (L) 0.7 - 4.0 K/uL   Monocytes Relative 6 %   Monocytes Absolute 0.4 0.1 - 1.0 K/uL   Eosinophils Relative 2 %   Eosinophils Absolute 0.1 0.0 - 0.5 K/uL   Basophils Relative 0 %   Basophils Absolute 0.0 0.0 - 0.1 K/uL   Immature Granulocytes 1 %   Abs Immature Granulocytes 0.03 0.00 - 0.07 K/uL    Comment: Performed at [facility], [address] [place], [place] [address]  Lactic acid, plasma     Status: None   Collection Time: 08/30/19  2:09 PM  Result Value Ref Range   Lactic Acid, Venous 0.7 0.5 - 1.9 mmol/L    Comment: Performed at [facility], [address] [place], [place] [id number]  Basic metabolic panel     Status: Abnormal   Collection Time: 08/30/19  2:09 PM  Result Value Ref Range   Sodium 139 135 - 145 mmol/L   Potassium 6.0 (H) 3.5 - 5.1 mmol/L    Chloride 101 98 - 111 mmol/L   CO2 26 22 - 32 mmol/L   Glucose, Bld 92 70 - 99 mg/dL   BUN 68 (H) 8 - 23 mg/dL   Creatinine, Ser 5.19 (H) 0.61 - 1.24 mg/dL   Calcium 8.7 (L) 8.9 - 10.3 mg/dL   GFR calc non Af Amer 10 (L) >60 mL/min   GFR calc Af Amer 12 (L) >60 mL/min   Anion gap 12 5 - 15    Comment: Performed at [facility], [address] [place], [place] [address]  Hepatic function panel     Status: Abnormal   Collection Time: 08/30/19  2:09 PM  Result Value Ref Range   Total Protein 5.9 (L) 6.5 - 8.1 g/dL   Albumin 2.2 (L) 3.5 - 5.0 g/dL   AST 11 (L) 15 - 41 U/L   ALT 11 0 - 44 U/L   Alkaline Phosphatase 63 38 - 126 U/L   Total Bilirubin 0.8 0.3 - 1.2 mg/dL   Bilirubin, Direct 0.1 0.0 - 0.2 mg/dL   Indirect Bilirubin 0.7 0.3 - 0.9 mg/dL    Comment: Performed at [facility], [address] [place], [place] [address]  SARS Coronavirus 2 [facility] order, Performed in [facility] hospital lab) Nasopharyngeal Nasopharyngeal Swab     Status: Abnormal   Collection Time: 08/30/19  2:27 PM   Specimen: Nasopharyngeal Swab  Result Value Ref Range   SARS Coronavirus 2 POSITIVE (A) NEGATIVE    Comment: RESULT CALLED TO, READ BACK BY AND VERIFIED WITH: MITCH BARKER AT 1556 ON 08/30/2019 [place]. (NOTE) If result is NEGATIVE SARS-CoV-2 target nucleic acids are NOT DETECTED. The SARS-CoV-2 RNA is generally detectable in upper and lower  respiratory specimens during the acute phase of infection. The lowest  concentration of SARS-CoV-2 viral copies this assay can detect is 250  copies / mL. A negative result does not preclude SARS-CoV-2 infection  and should not be used as the sole basis for treatment or other  patient management decisions.  A negative result may occur with  improper specimen collection / handling, submission of specimen other  than nasopharyngeal swab, presence of viral mutation(s) within the  areas targeted by this assay, and inadequate number of viral  copies  (<250 copies / mL). A negative result  must be combined with clinical  observations, patient history, and epidemiological information. If result is POSITIVE SARS-CoV-2 target nucleic acids are DETECTED.  The SARS-CoV-2 RNA is generally detectable in upper and lower  respiratory specimens during the acute phase of infection.  Positive  results are indicative of active infection with SARS-CoV-2.  Clinical  correlation with patient history and other diagnostic information is  necessary to determine patient infection status.  Positive results do  not rule out bacterial infection or co-infection with other viruses. If result is PRESUMPTIVE POSTIVE SARS-CoV-2 nucleic acids MAY BE PRESENT.   A presumptive positive result was obtained on the submitted specimen  and confirmed on repeat testing.  While 2019 novel coronavirus  (SARS-CoV-2) nucleic acids may be present in the submitted sample  additional confirmatory testing may be necessary for epidemiological  and / or clinical management purposes  to differentiate between  SARS-CoV-2 and other Sarbecovirus currently known to infect humans.  If clinically indicated additional testing with an alternate test  methodology [phone])  is advised. The SARS-CoV-2 RNA is generally  detectable in upper and lower respiratory specimens during the acute  phase of infection. The expected result is Negative. Fact Sheet for Patients:  StrictlyIdeas.no Fact Sheet for Healthcare Providers: BankingDealers.co.za This test is not yet approved or cleared by the [place] FDA and has been authorized for detection and/or diagnosis of SARS-CoV-2 by FDA under an Emergency Use Authorization (EUA).  This EUA will remain in effect (meaning this test can be used) for the duration of the COVID-19 declaration under Section 564(b)(1) of the Act, 21 U.S.C. section 360bbb-3(b)(1), unless the authorization is terminated  or revoked sooner. Performed at [facility], [address] [place], [place] [address]    Dg Chest Portable 1 View  Result Date: 08/30/2019 CLINICAL DATA:  Shortness of breath. Renal failure. Recent COVID-19 positive EXAM: PORTABLE CHEST 1 VIEW COMPARISON:  August 17, 2019 FINDINGS: There is cardiomegaly with pulmonary venous hypertension. There are pleural effusions bilaterally, significantly larger on the left than on the right. Pleural effusion appears at least partially loculated on the left. There is patchy airspace opacity throughout the right lower lung zone and in the left upper lobe, new in the right base and increased in the left upper lobe. There is consolidation in the left base medially. No adenopathy.  There is aortic atherosclerosis.  No bone lesions. IMPRESSION: Suspect multifocal pneumonia superimposed on pulmonary vascular congestion and likely congestive heart failure. It is possible that all of the changes present are due to congestive heart failure, although the appearance is more suggestive of a combination of pneumonia and edema. No adenopathy. Aortic Atherosclerosis (ICD10-I70.0). Electronically Signed   By: Lowella Grip III M.D.   On: 08/30/2019 15:07    Pending Labs [employer] (From admission, onward)    Start     Ordered   Signed and Occupational hygienist morning,   R     Signed and Held   Signed and Held  CBC  Tomorrow morning,   R     Signed and Held   Signed and Held  Hemoglobin A1c  Once,   R    Comments: To assess prior glycemic control    Signed and Held          Vitals/Pain Today's Vitals   08/30/19 1745 08/30/19 1800 08/30/19 1830 08/30/19 1834  BP: (!) 82/52 (!) 80/50  (!) 74/47  Pulse: 65 66 71 67  Resp:  16 16 17 17   SpO2: 92% 94% 94%   Weight:      Height:      PainSc:        Isolation Precautions Airborne and Contact precautions  Medications Medications  patiromer (VELTASSA) packet 16.8 g (16.8  g Oral Given 08/30/19 1826)  sodium chloride 0.9 % bolus 250 mL (0 mLs Intravenous Stopped 08/30/19 1631)  calcium gluconate 1 g/ 50 mL sodium chloride IVPB (0 g Intravenous Stopped 08/30/19 1643)  midodrine (PROAMATINE) tablet 10 mg (10 mg Oral Given 08/30/19 1741)  sodium chloride 0.9 % bolus 250 mL (0 mLs Intravenous Stopped 08/30/19 1842)  sodium bicarbonate injection 50 mEq (50 mEq Intravenous Given 08/30/19 1817)  insulin aspart (novoLOG) injection 5 Units (5 Units Intravenous Given 08/30/19 1756)    And  dextrose 50 % solution 50 mL (50 mLs Intravenous Given 08/30/19 1757)  acetaminophen (TYLENOL) tablet 650 mg (650 mg Oral Given 08/30/19 1740)    Mobility non-ambulatory Moderate fall risk   Focused Assessments Pulmonary Assessment Handoff:  Lung sounds:   O2 Device: Nasal Cannula O2 Flow Rate (L/min): 2 L/min      R Recommendations: See Admitting Provider Note  Report given to:   Additional Notes: 2L for comfort

## 2019-08-30 NOTE — Progress Notes (Signed)
Dr. Holley Raring called, and is going to place pt on CRRT given that he is requiring Levophed to maintain MAP >65.  Per Dr. Holley Raring, pt will need temporary dialysis line placed.  Called and updated pt's daughter Jacek Kocinski on her father's status and that Nephrology wants to initiate CRRT as he won't likely be able to tolerate HD.  Carolin Sicks gives consent for placement of temporary HD catheter and for CRRT/ HD throughout hospitalization.  All questions answered, she is very appreciative of update.    Darel Hong, AGACNP-BC [facility] Pager: [phone] Cell: [phone]

## 2019-08-30 NOTE — ED Triage Notes (Signed)
Pt arrived via [facility] from dialysis, they called when pt became hypotensive and weak. Per EMS Pt covid positive on the 7th or 8th and just released from [facility]. EMS had pt on 2L O2 for SOB and Pt had diminished lung sounds bilateral lower lobes.

## 2019-08-31 ENCOUNTER — Inpatient Hospital Stay (HOSPITAL_COMMUNITY)
Admission: AD | Admit: 2019-08-31 | Discharge: 2019-09-08 | DRG: 871 | Disposition: E | Payer: Medicare Other | Source: Other Acute Inpatient Hospital | Attending: Internal Medicine | Admitting: Internal Medicine

## 2019-08-31 ENCOUNTER — Inpatient Hospital Stay: Payer: Medicare Other

## 2019-08-31 DIAGNOSIS — J9601 Acute respiratory failure with hypoxia: Secondary | ICD-10-CM | POA: Diagnosis present

## 2019-08-31 DIAGNOSIS — I1311 Hypertensive heart and chronic kidney disease without heart failure, with stage 5 chronic kidney disease, or end stage renal disease: Secondary | ICD-10-CM | POA: Diagnosis not present

## 2019-08-31 DIAGNOSIS — R042 Hemoptysis: Secondary | ICD-10-CM | POA: Diagnosis not present

## 2019-08-31 DIAGNOSIS — J90 Pleural effusion, not elsewhere classified: Secondary | ICD-10-CM | POA: Diagnosis not present

## 2019-08-31 DIAGNOSIS — Z515 Encounter for palliative care: Secondary | ICD-10-CM | POA: Diagnosis not present

## 2019-08-31 DIAGNOSIS — I468 Cardiac arrest due to other underlying condition: Secondary | ICD-10-CM | POA: Diagnosis not present

## 2019-08-31 DIAGNOSIS — U071 COVID-19: Secondary | ICD-10-CM | POA: Diagnosis present

## 2019-08-31 DIAGNOSIS — A4189 Other specified sepsis: Secondary | ICD-10-CM | POA: Diagnosis present

## 2019-08-31 DIAGNOSIS — Z66 Do not resuscitate: Secondary | ICD-10-CM | POA: Diagnosis not present

## 2019-08-31 DIAGNOSIS — R0602 Shortness of breath: Secondary | ICD-10-CM | POA: Diagnosis not present

## 2019-08-31 DIAGNOSIS — E1122 Type 2 diabetes mellitus with diabetic chronic kidney disease: Secondary | ICD-10-CM

## 2019-08-31 DIAGNOSIS — E8770 Fluid overload, unspecified: Secondary | ICD-10-CM | POA: Diagnosis present

## 2019-08-31 DIAGNOSIS — E7800 Pure hypercholesterolemia, unspecified: Secondary | ICD-10-CM | POA: Diagnosis present

## 2019-08-31 DIAGNOSIS — E872 Acidosis: Secondary | ICD-10-CM | POA: Diagnosis not present

## 2019-08-31 DIAGNOSIS — I517 Cardiomegaly: Secondary | ICD-10-CM | POA: Diagnosis present

## 2019-08-31 DIAGNOSIS — E785 Hyperlipidemia, unspecified: Secondary | ICD-10-CM | POA: Diagnosis not present

## 2019-08-31 DIAGNOSIS — Z79899 Other long term (current) drug therapy: Secondary | ICD-10-CM

## 2019-08-31 DIAGNOSIS — I959 Hypotension, unspecified: Secondary | ICD-10-CM | POA: Diagnosis present

## 2019-08-31 DIAGNOSIS — D509 Iron deficiency anemia, unspecified: Secondary | ICD-10-CM | POA: Diagnosis not present

## 2019-08-31 DIAGNOSIS — J1289 Other viral pneumonia: Secondary | ICD-10-CM | POA: Diagnosis present

## 2019-08-31 DIAGNOSIS — Z992 Dependence on renal dialysis: Secondary | ICD-10-CM | POA: Diagnosis not present

## 2019-08-31 DIAGNOSIS — I10 Essential (primary) hypertension: Secondary | ICD-10-CM | POA: Diagnosis not present

## 2019-08-31 DIAGNOSIS — D631 Anemia in chronic kidney disease: Secondary | ICD-10-CM | POA: Diagnosis present

## 2019-08-31 DIAGNOSIS — R579 Shock, unspecified: Secondary | ICD-10-CM

## 2019-08-31 DIAGNOSIS — I2699 Other pulmonary embolism without acute cor pulmonale: Secondary | ICD-10-CM | POA: Diagnosis present

## 2019-08-31 DIAGNOSIS — L89152 Pressure ulcer of sacral region, stage 2: Secondary | ICD-10-CM | POA: Diagnosis present

## 2019-08-31 DIAGNOSIS — R6521 Severe sepsis with septic shock: Secondary | ICD-10-CM | POA: Diagnosis present

## 2019-08-31 DIAGNOSIS — Z794 Long term (current) use of insulin: Secondary | ICD-10-CM | POA: Diagnosis not present

## 2019-08-31 DIAGNOSIS — J811 Chronic pulmonary edema: Secondary | ICD-10-CM | POA: Diagnosis present

## 2019-08-31 DIAGNOSIS — N186 End stage renal disease: Secondary | ICD-10-CM

## 2019-08-31 LAB — BLOOD GAS, ARTERIAL
Acid-base deficit: 5.4 mmol/L — ABNORMAL HIGH (ref 0.0–2.0)
Allens test (pass/fail): POSITIVE — AB
Bicarbonate: 25.2 mmol/L (ref 20.0–28.0)
FIO2: 0.8
MECHVT: 500 mL
O2 Saturation: 99.8 %
Patient temperature: 37
RATE: 20 resp/min
pCO2 arterial: 74 mmHg (ref 32.0–48.0)
pH, Arterial: 7.14 — CL (ref 7.350–7.450)
pO2, Arterial: 258 mmHg — ABNORMAL HIGH (ref 83.0–108.0)

## 2019-08-31 LAB — CBC WITH DIFFERENTIAL/PLATELET
Abs Immature Granulocytes: 0.02 10*3/uL (ref 0.00–0.07)
Abs Immature Granulocytes: 0.22 10*3/uL — ABNORMAL HIGH (ref 0.00–0.07)
Basophils Absolute: 0 10*3/uL (ref 0.0–0.1)
Basophils Absolute: 0 10*3/uL (ref 0.0–0.1)
Basophils Relative: 0 %
Basophils Relative: 0 %
Eosinophils Absolute: 0 10*3/uL (ref 0.0–0.5)
Eosinophils Absolute: 0 10*3/uL (ref 0.0–0.5)
Eosinophils Relative: 1 %
Eosinophils Relative: 0 %
HCT: 31 % — ABNORMAL LOW (ref 39.0–52.0)
HCT: 29 % — ABNORMAL LOW (ref 39.0–52.0)
Hemoglobin: 8.8 g/dL — ABNORMAL LOW (ref 13.0–17.0)
Hemoglobin: 8 g/dL — ABNORMAL LOW (ref 13.0–17.0)
Immature Granulocytes: 0 %
Immature Granulocytes: 2 %
Lymphocytes Relative: 5 %
Lymphocytes Relative: 4 %
Lymphs Abs: 0.2 10*3/uL — ABNORMAL LOW (ref 0.7–4.0)
Lymphs Abs: 0.5 10*3/uL — ABNORMAL LOW (ref 0.7–4.0)
MCH: 26.3 pg (ref 26.0–34.0)
MCH: 26.5 pg (ref 26.0–34.0)
MCHC: 28.4 g/dL — ABNORMAL LOW (ref 30.0–36.0)
MCHC: 27.6 g/dL — ABNORMAL LOW (ref 30.0–36.0)
MCV: 92.8 fL (ref 80.0–100.0)
MCV: 96 fL (ref 80.0–100.0)
Monocytes Absolute: 0.2 10*3/uL (ref 0.1–1.0)
Monocytes Absolute: 0.3 10*3/uL (ref 0.1–1.0)
Monocytes Relative: 4 %
Monocytes Relative: 3 %
Neutro Abs: 4.3 10*3/uL (ref 1.7–7.7)
Neutro Abs: 10.9 10*3/uL — ABNORMAL HIGH (ref 1.7–7.7)
Neutrophils Relative %: 90 %
Neutrophils Relative %: 91 %
Platelets: 156 10*3/uL (ref 150–400)
Platelets: 172 10*3/uL (ref 150–400)
RBC: 3.34 MIL/uL — ABNORMAL LOW (ref 4.22–5.81)
RBC: 3.02 MIL/uL — ABNORMAL LOW (ref 4.22–5.81)
RDW: 19.5 % — ABNORMAL HIGH (ref 11.5–15.5)
RDW: 19.7 % — ABNORMAL HIGH (ref 11.5–15.5)
WBC: 4.7 10*3/uL (ref 4.0–10.5)
WBC: 12 10*3/uL — ABNORMAL HIGH (ref 4.0–10.5)
nRBC: 0 % (ref 0.0–0.2)
nRBC: 0.2 % (ref 0.0–0.2)

## 2019-08-31 LAB — TRIGLYCERIDES: Triglycerides: 176 mg/dL — ABNORMAL HIGH (ref <150)

## 2019-08-31 LAB — GLUCOSE, CAPILLARY
Glucose-Capillary: 67 mg/dL — ABNORMAL LOW (ref 70–99)
Glucose-Capillary: 102 mg/dL — ABNORMAL HIGH (ref 70–99)
Glucose-Capillary: 124 mg/dL — ABNORMAL HIGH (ref 70–99)
Glucose-Capillary: 128 mg/dL — ABNORMAL HIGH (ref 70–99)
Glucose-Capillary: 133 mg/dL — ABNORMAL HIGH (ref 70–99)
Glucose-Capillary: 133 mg/dL — ABNORMAL HIGH (ref 70–99)

## 2019-08-31 LAB — COMPREHENSIVE METABOLIC PANEL
ALT: 11 U/L (ref 0–44)
ALT: 13 U/L (ref 0–44)
AST: 10 U/L — ABNORMAL LOW (ref 15–41)
AST: 19 U/L (ref 15–41)
Albumin: 2.2 g/dL — ABNORMAL LOW (ref 3.5–5.0)
Albumin: 2.3 g/dL — ABNORMAL LOW (ref 3.5–5.0)
Alkaline Phosphatase: 62 U/L (ref 38–126)
Alkaline Phosphatase: 75 U/L (ref 38–126)
Anion gap: 7 (ref 5–15)
Anion gap: 9 (ref 5–15)
BUN: 61 mg/dL — ABNORMAL HIGH (ref 8–23)
BUN: 33 mg/dL — ABNORMAL HIGH (ref 8–23)
CO2: 28 mmol/L (ref 22–32)
CO2: 27 mmol/L (ref 22–32)
Calcium: 8.7 mg/dL — ABNORMAL LOW (ref 8.9–10.3)
Calcium: 8.7 mg/dL — ABNORMAL LOW (ref 8.9–10.3)
Chloride: 105 mmol/L (ref 98–111)
Chloride: 101 mmol/L (ref 98–111)
Creatinine, Ser: 4.54 mg/dL — ABNORMAL HIGH (ref 0.61–1.24)
Creatinine, Ser: 2.28 mg/dL — ABNORMAL HIGH (ref 0.61–1.24)
GFR calc Af Amer: 14 mL/min — ABNORMAL LOW (ref >60)
GFR calc Af Amer: 32 mL/min — ABNORMAL LOW (ref >60)
GFR calc non Af Amer: 12 mL/min — ABNORMAL LOW (ref >60)
GFR calc non Af Amer: 28 mL/min — ABNORMAL LOW (ref >60)
Glucose, Bld: 94 mg/dL (ref 70–99)
Glucose, Bld: 119 mg/dL — ABNORMAL HIGH (ref 70–99)
Potassium: 5 mmol/L (ref 3.5–5.1)
Potassium: 3.5 mmol/L (ref 3.5–5.1)
Sodium: 140 mmol/L (ref 135–145)
Sodium: 137 mmol/L (ref 135–145)
Total Bilirubin: 0.8 mg/dL (ref 0.3–1.2)
Total Bilirubin: 0.8 mg/dL (ref 0.3–1.2)
Total Protein: 5.7 g/dL — ABNORMAL LOW (ref 6.5–8.1)
Total Protein: 6.1 g/dL — ABNORMAL LOW (ref 6.5–8.1)

## 2019-08-31 LAB — RENAL FUNCTION PANEL
Albumin: 2.3 g/dL — ABNORMAL LOW (ref 3.5–5.0)
Albumin: 2.3 g/dL — ABNORMAL LOW (ref 3.5–5.0)
Anion gap: 5 (ref 5–15)
Anion gap: 9 (ref 5–15)
BUN: 51 mg/dL — ABNORMAL HIGH (ref 8–23)
BUN: 33 mg/dL — ABNORMAL HIGH (ref 8–23)
CO2: 29 mmol/L (ref 22–32)
CO2: 27 mmol/L (ref 22–32)
Calcium: 8.7 mg/dL — ABNORMAL LOW (ref 8.9–10.3)
Calcium: 8.7 mg/dL — ABNORMAL LOW (ref 8.9–10.3)
Chloride: 105 mmol/L (ref 98–111)
Chloride: 101 mmol/L (ref 98–111)
Creatinine, Ser: 3.61 mg/dL — ABNORMAL HIGH (ref 0.61–1.24)
Creatinine, Ser: 2.3 mg/dL — ABNORMAL HIGH (ref 0.61–1.24)
GFR calc Af Amer: 18 mL/min — ABNORMAL LOW (ref >60)
GFR calc Af Amer: 32 mL/min — ABNORMAL LOW (ref >60)
GFR calc non Af Amer: 16 mL/min — ABNORMAL LOW (ref >60)
GFR calc non Af Amer: 27 mL/min — ABNORMAL LOW (ref >60)
Glucose, Bld: 134 mg/dL — ABNORMAL HIGH (ref 70–99)
Glucose, Bld: 116 mg/dL — ABNORMAL HIGH (ref 70–99)
Phosphorus: 4.4 mg/dL (ref 2.5–4.6)
Phosphorus: 2.8 mg/dL (ref 2.5–4.6)
Potassium: 4.6 mmol/L (ref 3.5–5.1)
Potassium: 3.5 mmol/L (ref 3.5–5.1)
Sodium: 139 mmol/L (ref 135–145)
Sodium: 137 mmol/L (ref 135–145)

## 2019-08-31 LAB — BRAIN NATRIURETIC PEPTIDE: B Natriuretic Peptide: 253 pg/mL — ABNORMAL HIGH (ref 0.0–100.0)

## 2019-08-31 LAB — PROTIME-INR
INR: 1.2 (ref 0.8–1.2)
Prothrombin Time: 14.6 seconds (ref 11.4–15.2)

## 2019-08-31 LAB — FIBRIN DERIVATIVES D-DIMER (ARMC ONLY): Fibrin derivatives D-dimer (ARMC): 1968.42 ng/mL (FEU) — ABNORMAL HIGH (ref 0.00–499.00)

## 2019-08-31 LAB — MAGNESIUM
Magnesium: 1.9 mg/dL (ref 1.7–2.4)
Magnesium: 1.9 mg/dL (ref 1.7–2.4)
Magnesium: 1.8 mg/dL (ref 1.7–2.4)

## 2019-08-31 LAB — C-REACTIVE PROTEIN: CRP: 17.5 mg/dL — ABNORMAL HIGH (ref <1.0)

## 2019-08-31 LAB — PROCALCITONIN: Procalcitonin: 0.58 ng/mL

## 2019-08-31 LAB — TROPONIN I (HIGH SENSITIVITY): Troponin I (High Sensitivity): 15 ng/L (ref <18)

## 2019-08-31 LAB — APTT: aPTT: 61 seconds — ABNORMAL HIGH (ref 24–36)

## 2019-08-31 LAB — FIBRINOGEN: Fibrinogen: 655 mg/dL — ABNORMAL HIGH (ref 210–475)

## 2019-08-31 LAB — FERRITIN: Ferritin: 963 ng/mL — ABNORMAL HIGH (ref 24–336)

## 2019-08-31 LAB — CORTISOL: Cortisol, Plasma: 27.1 ug/dL

## 2019-08-31 MED ORDER — PROPOFOL 1000 MG/100ML IV EMUL
5.0000 ug/kg/min | INTRAVENOUS | Status: DC
  Administered 2019-08-31: 17:00:00 30 ug/kg/min via INTRAVENOUS
  Administered 2019-08-31: 50 ug/kg/min via INTRAVENOUS
  Filled 2019-08-31: qty 100

## 2019-08-31 MED ORDER — FENTANYL CITRATE (PF) 100 MCG/2ML IJ SOLN: 25.0000 ug | INTRAMUSCULAR | Status: DC | PRN

## 2019-08-31 MED ORDER — PRISMASOL BGK 4/2.5 32-4-2.5 MEQ/L IV SOLN
INTRAVENOUS | Status: DC
  Administered 2019-08-31 – 2019-09-01 (×4): via INTRAVENOUS_CENTRAL

## 2019-08-31 MED ORDER — ORAL CARE MOUTH RINSE
15.0000 mL | OROMUCOSAL | Status: DC
  Administered 2019-08-31 – 2019-09-01 (×5): 15 mL via OROMUCOSAL

## 2019-08-31 MED ORDER — DOPAMINE-DEXTROSE 3.2-5 MG/ML-% IV SOLN
0.0000 ug/kg/min | INTRAVENOUS | Status: DC
  Administered 2019-08-31: 14:00:00 5 ug/kg/min via INTRAVENOUS

## 2019-08-31 MED ORDER — HEPARIN (PORCINE) 25000 UT/250ML-% IV SOLN
1400.0000 [IU]/h | INTRAVENOUS | Status: DC
  Administered 2019-08-31: 1400 [IU]/h via INTRAVENOUS
  Filled 2019-08-31: qty 250

## 2019-08-31 MED ORDER — HEPARIN SODIUM (PORCINE) 1000 UNIT/ML IJ SOLN: 1000.0000 [IU] | INTRAMUSCULAR | Status: AC | PRN

## 2019-08-31 MED ORDER — SODIUM CHLORIDE 0.9 % IV SOLN: 2.0000 g | Freq: Two times a day (BID) | INTRAVENOUS | Status: DC

## 2019-08-31 MED ORDER — ORAL CARE MOUTH RINSE
15.0000 mL | OROMUCOSAL | Status: DC
  Administered 2019-08-31: 16:00:00 15 mL via OROMUCOSAL

## 2019-08-31 MED ORDER — ACETAMINOPHEN 325 MG PO TABS: 650.0000 mg | ORAL_TABLET | Freq: Four times a day (QID) | ORAL | Status: DC | PRN

## 2019-08-31 MED ORDER — PROPOFOL 1000 MG/100ML IV EMUL: 5.0000 ug/kg/min | INTRAVENOUS | Status: AC

## 2019-08-31 MED ORDER — BISACODYL 5 MG PO TBEC: 5.0000 mg | DELAYED_RELEASE_TABLET | Freq: Every day | ORAL | Status: DC | PRN

## 2019-08-31 MED ORDER — VANCOMYCIN HCL IN DEXTROSE 1-5 GM/200ML-% IV SOLN: 1000.0000 mg | INTRAVENOUS | Status: DC

## 2019-08-31 MED ORDER — DOPAMINE-DEXTROSE 3.2-5 MG/ML-% IV SOLN
INTRAVENOUS | Status: AC
  Administered 2019-08-31: 5 ug/kg/min via INTRAVENOUS
  Filled 2019-08-31: qty 250

## 2019-08-31 MED ORDER — MIDAZOLAM HCL 2 MG/2ML IJ SOLN: 1.0000 mg | INTRAMUSCULAR | 0 refills | Status: AC | PRN

## 2019-08-31 MED ORDER — VITAL HIGH PROTEIN PO LIQD
1000.0000 mL | ORAL | Status: DC
  Administered 2019-08-31: 1000 mL

## 2019-08-31 MED ORDER — DOPAMINE-DEXTROSE 3.2-5 MG/ML-% IV SOLN
0.0000 ug/kg/min | INTRAVENOUS | Status: DC
  Administered 2019-08-31: 10 ug/kg/min via INTRAVENOUS
  Administered 2019-09-01: 15 ug/kg/min via INTRAVENOUS
  Filled 2019-08-31 (×2): qty 250

## 2019-08-31 MED ORDER — ACETAMINOPHEN 325 MG PO TABS: 650.0000 mg | ORAL_TABLET | Freq: Four times a day (QID) | ORAL | Status: AC | PRN

## 2019-08-31 MED ORDER — PANTOPRAZOLE SODIUM 40 MG PO TBEC: 40.0000 mg | DELAYED_RELEASE_TABLET | Freq: Every day | ORAL | Status: DC

## 2019-08-31 MED ORDER — HEPARIN (PORCINE) 25000 UT/250ML-% IV SOLN
1500.0000 [IU]/h | INTRAVENOUS | Status: DC
  Administered 2019-08-31 – 2019-09-01 (×2): 1500 [IU]/h via INTRAVENOUS
  Filled 2019-08-31: qty 250

## 2019-08-31 MED ORDER — SODIUM CHLORIDE 0.9 % IV SOLN
2.0000 g | Freq: Two times a day (BID) | INTRAVENOUS | Status: DC
  Filled 2019-08-31 (×2): qty 2

## 2019-08-31 MED ORDER — NOREPINEPHRINE 16 MG/250ML-% IV SOLN
0.0000 ug/min | INTRAVENOUS | Status: DC
  Administered 2019-08-31: 14:00:00 20 ug/min via INTRAVENOUS
  Administered 2019-08-31: 07:00:00 5 ug/min via INTRAVENOUS
  Filled 2019-08-31: qty 250

## 2019-08-31 MED ORDER — MIDAZOLAM HCL 2 MG/2ML IJ SOLN: 1.0000 mg | INTRAMUSCULAR | Status: DC | PRN

## 2019-08-31 MED ORDER — FENTANYL 2500MCG IN NS 250ML (10MCG/ML) PREMIX INFUSION
25.0000 ug/h | INTRAVENOUS | Status: DC
  Administered 2019-08-31: 25 ug/h via INTRAVENOUS
  Administered 2019-09-01: 200 ug/h via INTRAVENOUS
  Filled 2019-08-31: qty 250

## 2019-08-31 MED ORDER — CHLORHEXIDINE GLUCONATE 0.12% ORAL RINSE (MEDLINE KIT)
15.0000 mL | Freq: Two times a day (BID) | OROMUCOSAL | Status: DC
  Administered 2019-08-31 – 2019-09-01 (×2): 15 mL via OROMUCOSAL

## 2019-08-31 MED ORDER — FENTANYL BOLUS VIA INFUSION
25.0000 ug | INTRAVENOUS | Status: DC | PRN
  Administered 2019-08-31: 25 ug via INTRAVENOUS
  Filled 2019-08-31: qty 25

## 2019-08-31 MED ORDER — HEPARIN SODIUM (PORCINE) 1000 UNIT/ML DIALYSIS
1000.0000 [IU] | INTRAMUSCULAR | Status: DC | PRN
  Administered 2019-09-01: 3000 [IU] via INTRAVENOUS_CENTRAL
  Filled 2019-08-31 (×2): qty 6

## 2019-08-31 MED ORDER — PATIROMER SORBITEX CALCIUM 8.4 G PO PACK: 16.8000 g | PACK | Freq: Every day | ORAL | Status: AC

## 2019-08-31 MED ORDER — CHLORHEXIDINE GLUCONATE CLOTH 2 % EX PADS
6.0000 | MEDICATED_PAD | Freq: Every day | CUTANEOUS | Status: DC
  Administered 2019-08-31: 6 via TOPICAL

## 2019-08-31 MED ORDER — PRO-STAT SUGAR FREE PO LIQD
30.0000 mL | Freq: Two times a day (BID) | ORAL | Status: DC
  Administered 2019-09-01: 30 mL
  Filled 2019-08-31: qty 30

## 2019-08-31 MED ORDER — HEPARIN (PORCINE) 25000 UT/250ML-% IV SOLN: 1400.0000 [IU]/h | INTRAVENOUS | Status: AC

## 2019-08-31 MED ORDER — NOREPINEPHRINE 16 MG/250ML-% IV SOLN: 0.0000 ug/min | INTRAVENOUS | Status: AC

## 2019-08-31 MED ORDER — INSULIN ASPART 100 UNIT/ML ~~LOC~~ SOLN: 0.0000 [IU] | Freq: Every day | SUBCUTANEOUS | 11 refills | Status: AC

## 2019-08-31 MED ORDER — SODIUM CHLORIDE 0.9 % IV SOLN
500.0000 mg | Freq: Every day | INTRAVENOUS | Status: DC
  Administered 2019-08-31: 500 mg via INTRAVENOUS
  Filled 2019-08-31 (×2): qty 500

## 2019-08-31 MED ORDER — CHLORHEXIDINE GLUCONATE 0.12% ORAL RINSE (MEDLINE KIT): 15.0000 mL | Freq: Two times a day (BID) | OROMUCOSAL | Status: DC

## 2019-08-31 MED ORDER — PRISMASOL BGK 4/2.5 32-4-2.5 MEQ/L REPLACEMENT SOLN
Status: DC
  Administered 2019-08-31 – 2019-09-01 (×2): via INTRAVENOUS_CENTRAL

## 2019-08-31 MED ORDER — FENTANYL CITRATE (PF) 100 MCG/2ML IJ SOLN: 25.0000 ug | Freq: Once | INTRAMUSCULAR | Status: DC

## 2019-08-31 MED ORDER — CHLORHEXIDINE GLUCONATE CLOTH 2 % EX PADS: 6.0000 | MEDICATED_PAD | Freq: Every day | CUTANEOUS | Status: AC

## 2019-08-31 MED ORDER — FENTANYL CITRATE (PF) 100 MCG/2ML IJ SOLN: 25.0000 ug | INTRAMUSCULAR | 0 refills | Status: AC | PRN

## 2019-08-31 MED ORDER — MIDAZOLAM HCL 2 MG/2ML IJ SOLN
INTRAMUSCULAR | Status: AC
  Administered 2019-08-31: 14:00:00 2 mg via INTRAVENOUS
  Filled 2019-08-31: qty 2

## 2019-08-31 MED ORDER — INSULIN ASPART 100 UNIT/ML ~~LOC~~ SOLN
0.0000 [IU] | SUBCUTANEOUS | Status: DC
  Administered 2019-08-31 (×2): 1 [IU] via SUBCUTANEOUS

## 2019-08-31 MED ORDER — HEPARIN SODIUM (PORCINE) 5000 UNIT/ML IJ SOLN: 5000.0000 [IU] | Freq: Three times a day (TID) | INTRAMUSCULAR | Status: DC

## 2019-08-31 MED ORDER — FENTANYL CITRATE (PF) 100 MCG/2ML IJ SOLN
25.0000 ug | INTRAMUSCULAR | Status: DC | PRN
  Administered 2019-08-31: 16:00:00 100 ug via INTRAVENOUS
  Filled 2019-08-31: qty 2

## 2019-08-31 MED ORDER — SODIUM CHLORIDE 0.9 % IV SOLN
1.0000 g | Freq: Every day | INTRAVENOUS | Status: DC
  Administered 2019-08-31: 09:00:00 1 g via INTRAVENOUS
  Filled 2019-08-31: qty 1
  Filled 2019-08-31: qty 10

## 2019-08-31 MED ORDER — HEPARIN (PORCINE) 2000 UNITS/L FOR CRRT
INTRAVENOUS_CENTRAL | Status: DC | PRN
  Administered 2019-08-31: 20:00:00 via INTRAVENOUS_CENTRAL
  Filled 2019-08-31 (×2): qty 1000

## 2019-08-31 MED ORDER — HEPARIN SODIUM (PORCINE) 1000 UNIT/ML DIALYSIS: 1000.0000 [IU] | INTRAMUSCULAR | Status: DC | PRN

## 2019-08-31 MED ORDER — MIDAZOLAM HCL 2 MG/2ML IJ SOLN
1.0000 mg | INTRAMUSCULAR | Status: DC | PRN
  Administered 2019-08-31: 16:00:00 1 mg via INTRAVENOUS

## 2019-08-31 MED ORDER — HEPARIN (PORCINE) 25000 UT/250ML-% IV SOLN
1400.0000 [IU]/h | INTRAVENOUS | Status: DC
  Administered 2019-08-31: 14:00:00 1400 [IU]/h via INTRAVENOUS

## 2019-08-31 MED ORDER — HEPARIN SODIUM (PORCINE) 1000 UNIT/ML IJ SOLN
1000.0000 [IU] | INTRAMUSCULAR | Status: DC | PRN
  Filled 2019-08-31: qty 6

## 2019-08-31 MED ORDER — POLYETHYLENE GLYCOL 3350 17 G PO PACK: 17.0000 g | PACK | Freq: Every day | ORAL | 0 refills | Status: AC | PRN

## 2019-08-31 MED ORDER — INSULIN ASPART 100 UNIT/ML ~~LOC~~ SOLN: 0.0000 [IU] | Freq: Three times a day (TID) | SUBCUTANEOUS | 11 refills | Status: AC

## 2019-08-31 MED ORDER — CHLORHEXIDINE GLUCONATE 0.12% ORAL RINSE (MEDLINE KIT): 15.0000 mL | Freq: Two times a day (BID) | OROMUCOSAL | 0 refills | Status: AC

## 2019-08-31 MED ORDER — VANCOMYCIN HCL 10 G IV SOLR
2000.0000 mg | Freq: Once | INTRAVENOUS | Status: AC
  Administered 2019-08-31: 2000 mg via INTRAVENOUS
  Filled 2019-08-31: qty 2000

## 2019-08-31 MED ORDER — FAMOTIDINE IN NACL 20-0.9 MG/50ML-% IV SOLN
20.0000 mg | INTRAVENOUS | Status: DC
  Administered 2019-08-31: 20 mg via INTRAVENOUS
  Filled 2019-08-31: qty 50

## 2019-08-31 MED ORDER — PROPOFOL 1000 MG/100ML IV EMUL: 0.0000 ug/kg/min | INTRAVENOUS | Status: DC

## 2019-08-31 MED ORDER — SODIUM CHLORIDE 0.9 % IV SOLN: 2.0000 g | Freq: Two times a day (BID) | INTRAVENOUS | Status: AC

## 2019-08-31 MED ORDER — MIDAZOLAM HCL 2 MG/2ML IJ SOLN
2.0000 mg | Freq: Once | INTRAMUSCULAR | Status: AC
  Administered 2019-08-31: 14:00:00 2 mg via INTRAVENOUS

## 2019-08-31 MED ORDER — ACETAMINOPHEN 650 MG RE SUPP: 650.0000 mg | Freq: Four times a day (QID) | RECTAL | 0 refills | Status: AC | PRN

## 2019-08-31 MED ORDER — DOPAMINE-DEXTROSE 3.2-5 MG/ML-% IV SOLN: 0.0000 ug/kg/min | INTRAVENOUS | Status: AC

## 2019-08-31 MED ORDER — ACETAMINOPHEN 650 MG RE SUPP: 650.0000 mg | Freq: Four times a day (QID) | RECTAL | Status: DC | PRN

## 2019-08-31 MED ORDER — MIDAZOLAM HCL 2 MG/2ML IJ SOLN
1.0000 mg | INTRAMUSCULAR | Status: DC | PRN
  Filled 2019-08-31: qty 2

## 2019-08-31 MED ORDER — ONDANSETRON HCL 4 MG PO TABS: 4.0000 mg | ORAL_TABLET | Freq: Four times a day (QID) | ORAL | 0 refills | Status: DC | PRN

## 2019-08-31 NOTE — Progress Notes (Signed)
Pt's CBG was 67mg /dL for the 0800 check. Pt was given orange juice and CBG re-checked. Same was 102mg /dL.

## 2019-08-31 NOTE — Procedures (Signed)
Hemodialysis Catheter Insertion Procedure Note Theodore Nguyen [id number] 1946-06-10  Procedure: Insertion of Hemodialysis Catheter Indications: Dialysis Access   Procedure Details Consent: Risks of procedure as well as the alternatives and risks of each were explained to the (patient/caregiver).  Consent for procedure obtained. Time Out: Verified patient identification, verified procedure, site/side was marked, verified correct patient position, special equipment/implants available, medications/allergies/relevent history reviewed, required imaging and test results available.  Performed  Maximum sterile technique was used including antiseptics, cap, gloves, gown, hand hygiene, mask and sheet. Skin prep: Chlorhexidine; local anesthetic administered Triple Lumen (Trialysis) hemodialysis catheter was inserted into left femoral vein due to patient being a dialysis patient using the Seldinger technique.  Evaluation Blood flow good Complications: No apparent complications Patient did tolerate procedure well. Chest X-ray ordered to verify placement.  CXR: Not needed, placed in Left Femoral Vein..  Procedure was performed using Ultrasound for direct visualization of cannulization of Left Femoral Vein.      Darel Hong, AGACNP-BC [facility] Pager: [phone] Cell: [phone]  Theodore Nguyen 08/24/2019

## 2019-08-31 NOTE — Progress Notes (Signed)
CRRT stopped at 1635

## 2019-08-31 NOTE — Progress Notes (Signed)
[facility] at bedside to transfer pt to [facility].

## 2019-08-31 NOTE — Progress Notes (Signed)
CRRT initiated at 0448. Pt tolerating therapy well thus far and is at target blood flow rate. Currently on 8 mcg Levophed with MAP goal of 60. Will continue to monitor.

## 2019-08-31 NOTE — H&P (Signed)
History and Physical    Theodore Nguyen FHQ:[id number] DOB: [birth year] DOA: (Not on file)  I have briefly reviewed the patient's prior medical records in [place]  PCP: Eber Hong, MD  Patient coming from: [place] ICU  Chief Complaint: hypotension  HPI: Theodore Nguyen is a 73 y.o. male with medical history significant of ESRD on HD, hypertension, hyperlipidemia, type 2 diabetes mellitus, who was recently hospitalized at [facility] for COVID-19 infection, received IV Remdesivir as well as dexamethasone.  He was discharged on 08/26/2019 to SNF and at that time he was very weak and deconditioned however he was stable and satting 96% on room air.  On 08/30/2019 he is being readmitted to [facility] from outpatient hemodialysis center with hypotension, shortness of breath, cough.  In the ER over there he was hypotensive with a systolic blood pressure in the 70s-80s, he received fluid bolus, midodrine and started on Levophed.  Chest x-ray was concerning for multifocal pneumonia with superimposed CHF and bilateral pulmonary effusions left greater than right.  He was admitted to the ICU, was given IV ceftriaxone and azithromycin.  He was also hyperkalemic, nephrology was consulted and was admitted to the ICU for CRRT given pressor requirements.  On 9/23 patient became hypoxic, hypotensive, unresponsive in PEA arrested.  He was coded for about 4 to 5 minutes and achieved ROSC.  He was intubated during code and was transferred to [place] on 08/26/2019 afternoon.  He is currently intubated and sedated.  Review of Systems: Unable to obtain review of system as patient is intubated and sedated  Past Medical History:  Diagnosis Date  . COVID-19 virus infection   . Diabetes mellitus without complication ([place])   . High cholesterol   . Hyperlipidemia   . Hypertension   . Iron deficiency anemia   . Renal disorder    dialysis    Past Surgical History:  Procedure Laterality Date  . BRAIN  SURGERY    . INSERTION OF DIALYSIS CATHETER    . IR THORACENTESIS ASP PLEURAL SPACE W/IMG GUIDE  08/17/2019  . left hand surgery    . right knee repair       reports that he has never smoked. He has never used smokeless tobacco. He reports that he does not drink alcohol or use drugs.  No Known Allergies  Family History  Problem Relation Age of Onset  . Cancer Mother     Prior to Admission medications   Medication Sig Start Date End Date Taking? Authorizing Provider  acetaminophen (TYLENOL 8 HOUR) 650 MG CR tablet Take 650 mg by mouth every 6 (six) hours.    [provider]  acetaminophen (TYLENOL) 325 MG tablet Take 2 tablets (650 mg total) by mouth every 6 (six) hours as needed for mild pain (or Fever >/= 101). 08/15/2019   Awilda Bill, NP  acetaminophen (TYLENOL) 650 MG suppository Place 1 suppository (650 mg total) rectally every 6 (six) hours as needed for mild pain (or Fever >/= 101). 09/04/2019   Awilda Bill, NP  amLODipine (NORVASC) 5 MG tablet Take 5 mg by mouth daily.    [provider]  aspirin EC 81 MG tablet Take 81 mg by mouth daily.    [provider]  atorvastatin (LIPITOR) 40 MG tablet Take 40 mg by mouth daily.    [provider]  b complex-vitamin c-folic acid (NEPHRO-VITE) 0.8 MG TABS tablet Take 1 tablet by mouth every morning.     [provider]  carvedilol (COREG) 6.25 MG tablet Take 6.25 mg by mouth daily.    [provider]  ceFEPIme 2 g in sodium chloride 0.9 % 100 mL Inject 2 g into the vein every 12 (twelve) hours. 08/26/2019   Awilda Bill, NP  Chlorhexidine Gluconate Cloth 2 % PADS Apply 6 each topically daily. 09-30-19   Awilda Bill, NP  chlorhexidine gluconate, MEDLINE KIT, (PERIDEX) 0.12 % solution 15 mLs by Mouth Rinse route 2 (two) times daily. 08/21/2019   Awilda Bill, NP  Cholecalciferol (VITAMIN D3) 50 MCG (2000 UT) TABS Take 2,000 Units by mouth daily.    [provider]   DOPamine (INTROPIN) 3.2-5 MG/ML-% infusion Inject 0-2,006 mcg/min into the vein continuous. 08/21/2019   Awilda Bill, NP  fentaNYL (SUBLIMAZE) 100 MCG/2ML injection Inject 0.5 mLs (25 mcg total) into the vein every 15 (fifteen) minutes as needed (to achieve RASS & CPOT goal.). 08/30/2019   Awilda Bill, NP  fentaNYL (SUBLIMAZE) 100 MCG/2ML injection Inject 0.5-2 mLs (25-100 mcg total) into the vein every 30 (thirty) minutes as needed (to maintain RASS & CPOT goal.). 08/21/2019   Awilda Bill, NP  gabapentin (NEURONTIN) 100 MG capsule Take 100 mg by mouth 3 (three) times daily.    [provider]  guaiFENesin-dextromethorphan (ROBITUSSIN DM) 100-10 MG/5ML syrup Take 5 mLs by mouth every 6 (six) hours as needed for cough. 08/21/19   Arrien, Jimmy Picket, MD  heparin 1000 UNIT/ML injection Inject 1-6 mLs (1,000-6,000 Units total) into the vein as needed (to fill dialysis catheter). 08/21/2019   Awilda Bill, NP  heparin 25000-0.45 UT/250ML-% infusion Inject 1,400 Units/hr into the vein continuous. 08/15/2019   Awilda Bill, NP  insulin aspart (NOVOLOG) 100 UNIT/ML injection Inject 0-5 Units into the skin at bedtime. 08/13/2019   Awilda Bill, NP  insulin aspart (NOVOLOG) 100 UNIT/ML injection Inject 0-9 Units into the skin 3 (three) times daily with meals. 08/09/2019   Awilda Bill, NP  isosorbide mononitrate (IMDUR) 30 MG 24 hr tablet Take 30 mg by mouth daily. HOLD FOR SYST9OLIC LEVELS >194    [provider]  midazolam (VERSED) 2 MG/2ML SOLN injection Inject 1 mL (1 mg total) into the vein every 15 (fifteen) minutes as needed (to achieve RASS goal). 09/06/2019   Awilda Bill, NP  midazolam (VERSED) 2 MG/2ML SOLN injection Inject 1 mL (1 mg total) into the vein every 2 (two) hours as needed (to maintain RASS goal.). 08/14/2019   Awilda Bill, NP  Multiple Vitamin (MULTIVITAMIN WITH MINERALS) TABS tablet Take 1 tablet by mouth daily.    [provider]   norepinephrine (LEVOPHED) 16-5 MG/250ML-% SOLN Inject 0-40 mcg/min into the vein continuous. 09/04/2019   Awilda Bill, NP  Nutritional Supplements (FEEDING SUPPLEMENT, NEPRO CARB STEADY,) LIQD Take 237 mLs by mouth 2 (two) times daily between meals. 08/21/19 09/20/19  Arrien, Jimmy Picket, MD  omeprazole (PRILOSEC) 40 MG capsule Take 40 mg by mouth 2 (two) times daily before a meal.     [provider]  ondansetron (ZOFRAN) 4 MG tablet Take 4 mg by mouth every 8 (eight) hours as needed for nausea or vomiting.    [provider]  patiromer (VELTASSA) 8.4 g packet Take 2 packets (16.8 g total) by mouth daily. 30-Sep-2019   Awilda Bill, NP  polyethylene glycol (MIRALAX / GLYCOLAX) 17 g packet Take 17 g by mouth daily as needed for mild constipation. 08/29/2019  Awilda Bill, NP  propofol (DIPRIVAN) 1000 MG/100ML EMUL injection Inject 501.5-8,024 mcg/min into the vein continuous. 08/14/2019   Awilda Bill, NP  sertraline (ZOLOFT) 25 MG tablet Take 25 mg by mouth daily.    [provider]    Physical Exam: There were no vitals filed for this visit.  Constitutional: Intubated, sedated Eyes: No scleral icterus ENMT: Moist mucous membranes, slight bleeding noted on the lower lip Neck: normal, supple Respiratory: Bibasilar crackles, coarse breath sounds throughout, diminished at the bases, scattered faint wheezing Cardiovascular: Regular rate and rhythm, no murmurs / rubs / gallops.  Fluid overloaded Abdomen: no tenderness, no masses palpated. Bowel sounds positive.  Musculoskeletal: no clubbing / cyanosis.  Skin: Unna boot removed, several ulceration areas posterior left leg, no significant drainage Neurologic: Sedated on the vent  Labs on Admission: I have personally reviewed following labs and imaging studies  CBC: Recent Labs  Lab 08/25/19 1512 08/30/19 1409 08/11/2019 0502 08/25/2019 1357  WBC 6.9 6.5 4.7 12.0*  NEUTROABS  --  5.8 4.3 10.9*  HGB 9.8*  8.5* 8.8* 8.0*  HCT 32.2* 29.4* 31.0* 29.0*  MCV 91.2 92.2 92.8 96.0  PLT 168 173 156 197   Basic Metabolic Panel: Recent Labs  Lab 08/30/19 1409 08/30/2019 0502 08/09/2019 1138 08/12/2019 1356 08/10/2019 1357  NA 139 140 139 137 137  K 6.0* 5.0 4.6 3.5 3.5  CL 101 105 105 101 101  CO2 26 28 29 27 27   GLUCOSE 92 94 134* 116* 119*  BUN 68* 61* 51* 33* 33*  CREATININE 5.19* 4.54* 3.61* 2.30* 2.28*  CALCIUM 8.7* 8.7* 8.7* 8.7* 8.7*  MG  --  1.9 1.9 1.8  --   PHOS  --   --  4.4 2.8  --    GFR: Estimated Creatinine Clearance: 35.9 mL/min (A) (by C-G formula based on SCr of 2.28 mg/dL (H)). Liver Function Tests: Recent Labs  Lab 08/30/19 1409 08/26/2019 0502 09/04/2019 1138 08/09/2019 1356 08/23/2019 1357  AST 11* 10*  --   --  19  ALT 11 11  --   --  13  ALKPHOS 63 62  --   --  75  BILITOT 0.8 0.8  --   --  0.8  PROT 5.9* 5.7*  --   --  6.1*  ALBUMIN 2.2* 2.2* 2.3* 2.3* 2.3*   No results for input(s): LIPASE, AMYLASE in the last 168 hours. No results for input(s): AMMONIA in the last 168 hours. Coagulation Profile: Recent Labs  Lab 08/14/2019 0502  INR 1.2   Cardiac Enzymes: No results for input(s): CKTOTAL, CKMB, CKMBINDEX, TROPONINI in the last 168 hours. BNP (last 3 results) No results for input(s): PROBNP in the last 8760 hours. HbA1C: No results for input(s): HGBA1C in the last 72 hours. CBG: Recent Labs  Lab 08/30/19 2106 08/25/2019 0714 08/13/2019 0842 09/06/2019 1339 08/17/2019 1618  GLUCAP 97 67* 102* 128* 133*   Lipid Profile: Recent Labs    08/21/2019 1356  TRIG 176*   Thyroid Function Tests: No results for input(s): TSH, T4TOTAL, FREET4, T3FREE, THYROIDAB in the last 72 hours. Anemia Panel: Recent Labs    08/24/2019 0502  FERRITIN 963*   Urine analysis: No results found for: COLORURINE, APPEARANCEUR, LABSPEC, PHURINE, GLUCOSEU, HGBUR, BILIRUBINUR, KETONESUR, PROTEINUR, UROBILINOGEN, NITRITE, LEUKOCYTESUR   Radiological Exams on Admission: US Venous Img  Upper Uni Left  Result Date: 09/02/2019 CLINICAL DATA:  Left arm swelling. COVID-19 positive. EXAM: LEFT UPPER EXTREMITY VENOUS DOPPLER ULTRASOUND TECHNIQUE: Gray-scale  sonography with graded compression, as well as color Doppler and duplex ultrasound were performed to evaluate the upper extremity deep venous system from the level of the subclavian vein and including the jugular, axillary, basilic, radial, ulnar and upper cephalic vein. Spectral Doppler was utilized to evaluate flow at rest and with distal augmentation maneuvers. COMPARISON:  None. FINDINGS: Contralateral Subclavian Vein: Respiratory phasicity is normal and symmetric with the symptomatic side. No evidence of thrombus. Normal compressibility. Internal Jugular Vein: No evidence of thrombus. Normal compressibility, respiratory phasicity and response to augmentation. Subclavian Vein: No evidence of thrombus. Normal compressibility, respiratory phasicity and response to augmentation. Axillary Vein: No evidence of thrombus. Normal compressibility, respiratory phasicity and response to augmentation. Cephalic Vein: No evidence of thrombus. Normal compressibility, respiratory phasicity and response to augmentation. Basilic Vein: No evidence of thrombus. Normal compressibility, respiratory phasicity and response to augmentation. Brachial Veins: No evidence of thrombus. Normal compressibility, respiratory phasicity and response to augmentation. Radial Veins: No evidence of thrombus. Normal compressibility, respiratory phasicity and response to augmentation. Ulnar Veins: No evidence of thrombus. Normal compressibility, respiratory phasicity and response to augmentation. Venous Reflux:  None visualized. Other Findings:  Subcutaneous edema noted in the forearm. IMPRESSION: No evidence of DVT within the left upper extremity. Electronically Signed   By: Keith Rake M.D.   On: 08/13/2019 00:55   Dg Chest Port 1 View  Result Date: 08/17/2019 CLINICAL DATA:   OG and endotracheal tube placement. EXAM: PORTABLE CHEST 1 VIEW COMPARISON:  08/30/2019 FINDINGS: The endotracheal tube terminates above the carina by approximately 2.5 cm. The enteric tube extends below the left hemidiaphragm. The lung volumes are low. The heart size is enlarged. There is a moderate-sized left-sided pleural effusion. There are bibasilar airspace opacities, similar to prior study. There is no acute osseous abnormality. A vascular stent is noted in the right axillary region. IMPRESSION: 1. Endotracheal tube terminates 2.5 cm above the carina. 2. Low lung volumes with bibasilar airspace opacities and left pleural effusion. 3. Stable cardiomegaly. 4. The enteric tube extends below the left hemidiaphragm. Electronically Signed   By: Constance Holster M.D.   On: 09/07/2019 14:34   Dg Chest Portable 1 View  Result Date: 08/30/2019 CLINICAL DATA:  Shortness of breath. Renal failure. Recent COVID-19 positive EXAM: PORTABLE CHEST 1 VIEW COMPARISON:  August 17, 2019 FINDINGS: There is cardiomegaly with pulmonary venous hypertension. There are pleural effusions bilaterally, significantly larger on the left than on the right. Pleural effusion appears at least partially loculated on the left. There is patchy airspace opacity throughout the right lower lung zone and in the left upper lobe, new in the right base and increased in the left upper lobe. There is consolidation in the left base medially. No adenopathy.  There is aortic atherosclerosis.  No bone lesions. IMPRESSION: Suspect multifocal pneumonia superimposed on pulmonary vascular congestion and likely congestive heart failure. It is possible that all of the changes present are due to congestive heart failure, although the appearance is more suggestive of a combination of pneumonia and edema. No adenopathy. Aortic Atherosclerosis (ICD10-I70.0). Electronically Signed   By: Lowella Grip III M.D.   On: 08/30/2019 15:07   Dg Abd Portable 1v   Result Date: 08/09/2019 CLINICAL DATA:  OG tube placement EXAM: PORTABLE ABDOMEN - 1 VIEW COMPARISON:  None. FINDINGS: The OG tube projects over the gastric body. The bowel gas pattern is nonspecific and nonobstructive. There appears to be a catheter overlying the left hemipelvis. IMPRESSION: OG tube projects over the  region of the gastric body. Electronically Signed   By: Constance Holster M.D.   On: 08/29/2019 14:35    EKG: Independently reviewed.  EKG 9/22 sinus rhythm with left bundle branch block  Assessment/Plan Active Problems:   Volume overload   ESRD (end stage renal disease) (HCC)   Essential hypertension   Type 2 diabetes mellitus with chronic kidney disease on chronic dialysis (HCC)   Anemia due to chronic kidney disease   COVID-19 virus infection   Pleural effusion on left   Hypotension   Principal Problem Hypotension/shock -Unclear etiology, PE is on the differential.  He was started on heparin infusion at [facility] prior to transfer, continue.  Quick bedside echo by Dr. Lake Bells shows enlarged RV as well as septum bowing into the LV.  He is on dopamine likely due to bradycardia post cardiac arrest, continue and wean off as tolerated -Obtain a CT angiogram  Active Problems End-stage renal disease on hemodialysis -Continue CRRT per nephrology, discussed with Dr. Posey Pronto.  Significantly fluid overloaded will need at least fluid removal  Acute hypoxic respiratory failure/left pleural effusion/volume overload -CRRT per nephrology, he is currently intubated, management per PCCM  Recent Covid pneumonia -Finished treatment with Remdesivir as well as steroids, no new interventions at this point but supportive treatment.  Hypertension -Pharmacy to reconcile home medications but hold for now due to shock/hypotension  Type 2 diabetes mellitus -Dietary consulted for tube feeds, for now every 4 CBCs/sliding scale.  Anemia of chronic renal disease -Stable, monitor  Coccyx  pressure ulcer stage II, present on admission -Local wound care  History of depression -On sertraline, resume after pharmacy reconciles meds  The patient is critically ill with multiple organ system failure and requires high complexity decision making for assessment and support, frequent evaluation and titration of therapies, advanced monitoring, review of radiographic studies and interpretation of complex data.    Critical Care Time devoted to patient care services, exclusive of separately billable procedures, described in this note is 45 minutes.    DVT prophylaxis: Heparin infusion Code Status: Presumed full code Family Communication: None Disposition Plan: Admit to ICU Consults called: PCCM   Marzetta Board, MD, PhD [facility]  Contact via [email or web address]  [facility] Office Info P: [phone] F: [phone]   09/05/2019, 5:38 PM

## 2019-08-31 NOTE — Progress Notes (Signed)
Received report from Herschel Senegal, RN in [facility] ICU.  Pt had left via [facility] approx 10 minutes prior.

## 2019-08-31 NOTE — ED Provider Notes (Signed)
Procedures     ----------------------------------------- 1:43 PM on 08/28/2019 -----------------------------------------   Responded to overhead CODE BLUE page per hospital policy.  On my arrival, ICU staff were at bedside intubating patient.  Patient noted to have color change on colorimetric capnometer, symmetric bilateral breath sounds with bagging through endotracheal tube.  Intensivist at bedside.  ED team signed off, continued care by ICU team.   Carrie Mew, MD 08/11/2019 1344

## 2019-08-31 NOTE — Progress Notes (Signed)
Attempted to place right femoral Trialysis Catheter.  Able to cannulate right femoral vein without difficultly using Ultrasound, however unable to thread guidewire without meeting resistance.  Changed angles with cannulization, however still unable to thread guidewire, therefore attempt aborted.  Pressure held at site for 10 minutes.  No signs of hematoma/bleeding or any complications.  Vitals signs remain unchanged.      Darel Hong, AGACNP-BC [facility] Pager: [phone] Cell: [phone]

## 2019-08-31 NOTE — Progress Notes (Signed)
NAME:  Theodore Nguyen, MRN:  [id number], DOB:  04-28-1946, LOS: 0 ADMISSION DATE:  09/04/2019, CONSULTATION DATE: August 31, 2019 REFERRING MD: Lanney Gins, CHIEF COMPLAINT: Cardiac arrest  Brief History   73 year old male with ESRD and recent treatment for COVID pneumonia through September 18 was admitted on August 30, 2021 [facility] for fatigue, dyspnea hypotension.  Had a cardiac arrest there, required intubation and CVVHD then moved to [facility].  History of present illness   73 year old male with ESRD and recent treatment for COVID pneumonia through September 18 was admitted on August 30, 2021 [facility] for fatigue, dyspnea hypotension.  Had a cardiac arrest there, required intubation and CVVHD then moved to [facility].  No further history could be obtained because the patient was intubated.  As I understand it from discussing the case with the patient's consulting pulmonary critical care physician he presented with fatigue, hypotension and dyspnea.  Was noted to have bilateral pleural effusions and pulmonary edema.  Was treated with CVVHD because of hypotension, dopamine was started for hypotension as well.  Volume removal was attempted.  However, later in the day after admission developed respiratory failure, PEA arrest, required brief CPR for approximately 3 to 5 minutes.  Intubated, started on dopamine, transferred to our facility for further management.  Upon arrival here he is hemodynamically stable on low-dose dopamine.  Past Medical History  End-stage renal disease Diabetes mellitus Hyperlipidemia Hypertension Iron deficiency anemia  Significant Hospital Events   September 22 admission to [facility] September 23 cardiac arrest, intubation, transferred to [place]:  Pulmonary and critical care medicine Nephrology  Procedures:  September 23 left femoral vein hemodialysis catheter September  23 ET tube  Significant Diagnostic Tests:  September 23 bedside echocardiogram showed dilated RV, LVH, normal LV size and function September 23 CT angiogram chest>>>  Micro Data:  September 8 SARS-CoV-2 positive September 23 blood culture  Antimicrobials:  September 22 cefepime September 22 vancomycin  Interim history/subjective:  As above  Objective   There were no vitals taken for this visit.    Vent Mode: PRVC FiO2 (%):  [50 %-80 %] 50 % Set Rate:  [20 bmp-28 bmp] 28 bmp Vt Set:  [500 mL-520 mL] 520 mL PEEP:  [5 cmH20] 5 cmH20 Plateau Pressure:  [25 cmH20] 25 cmH20  No intake or output data in the 24 hours ending 08/30/2019 1841 There were no vitals filed for this visit.  Examination:  General:  In bed on vent HENT: NCAT ETT in place PULM: CTA B, vent supported breathing CV: RRR, no mgr GI: BS+, soft, nontender MSK: normal bulk and tone Derm: ulceration R calf, significant edema/anasarca Neuro: sedated on vent  September 23 chest x-ray images independently reviewed showing chest x-ray in place, cardiomegaly, atelectasis bilaterally, bilateral pleural effusions, interstitial  Resolved Hospital Problem list     Assessment & Plan:  Acute respiratory failure with hypoxemia: Considering recent COVID-19 pneumonia, dilated RV, suspect pulmonary hypertension in setting of either pulmonary venous hypertension from acute pulmonary edema, decompensated diastolic heart failure versus more likely acute pulmonary embolism Acute resp acidosis  Continue full mechanical ventilatory support Ventilator associated pneumonia prevention protocol CT angiogram chest Empiric heparin Daily wake-up assessment and spontaneous breathing trial Repeat ABG   Possible healthcare associated pneumonia? Cefepime Vancomycin Send respiratory culture  Shock, dilated RV, question pulmonary embolism Continue dopamine for now titrate to MAP > 65 Check CT angiogram chest  ESRD Continue CVVHD,  consult renal  Leg wound present on admission Wound care  Best practice:  Diet: tube feeding Pain/Anxiety/Delirium protocol (if indicated): RASS goal -2, PAD protocol fentanyl infusion and prn versed VAP protocol (if indicated): yes DVT prophylaxis: heparin infusion GI prophylaxis: famotidine Glucose control: SSI Mobility: bed rest Code Status: FULL Family Communication: per [facility] Disposition: remain in ICU  Labs   CBC: Recent Labs  Lab 08/25/19 1512 08/30/19 1409 08/17/2019 0502 08/17/2019 1357  WBC 6.9 6.5 4.7 12.0*  NEUTROABS  --  5.8 4.3 10.9*  HGB 9.8* 8.5* 8.8* 8.0*  HCT 32.2* 29.4* 31.0* 29.0*  MCV 91.2 92.2 92.8 96.0  PLT 168 173 156 101    Basic Metabolic Panel: Recent Labs  Lab 08/30/19 1409 08/13/2019 0502 09/02/2019 1138 08/19/2019 1356 08/10/2019 1357  NA 139 140 139 137 137  K 6.0* 5.0 4.6 3.5 3.5  CL 101 105 105 101 101  CO2 26 28 29 27 27   GLUCOSE 92 94 134* 116* 119*  BUN 68* 61* 51* 33* 33*  CREATININE 5.19* 4.54* 3.61* 2.30* 2.28*  CALCIUM 8.7* 8.7* 8.7* 8.7* 8.7*  MG  --  1.9 1.9 1.8  --   PHOS  --   --  4.4 2.8  --    GFR: Estimated Creatinine Clearance: 35.9 mL/min (A) (by C-G formula based on SCr of 2.28 mg/dL (H)). Recent Labs  Lab 08/25/19 1512 08/30/19 1409 08/09/2019 0502 08/20/2019 1357  PROCALCITON  --   --  0.58  --   WBC 6.9 6.5 4.7 12.0*  LATICACIDVEN  --  0.7  --   --     Liver Function Tests: Recent Labs  Lab 08/30/19 1409 09/07/2019 0502 08/23/2019 1138 09/05/2019 1356 08/18/2019 1357  AST 11* 10*  --   --  19  ALT 11 11  --   --  13  ALKPHOS 63 62  --   --  75  BILITOT 0.8 0.8  --   --  0.8  PROT 5.9* 5.7*  --   --  6.1*  ALBUMIN 2.2* 2.2* 2.3* 2.3* 2.3*   No results for input(s): LIPASE, AMYLASE in the last 168 hours. No results for input(s): AMMONIA in the last 168 hours.  ABG    Component Value Date/Time   PHART 7.14 (LL) 08/15/2019 1357   PCO2ART 74 (HH) 08/22/2019 1357   PO2ART 258 (H) 08/25/2019 1357   HCO3 25.2  09/07/2019 1357   ACIDBASEDEF 5.4 (H) 09/06/2019 1357   O2SAT 99.8 08/17/2019 1357     Coagulation Profile: Recent Labs  Lab 08/13/2019 0502  INR 1.2    Cardiac Enzymes: No results for input(s): CKTOTAL, CKMB, CKMBINDEX, TROPONINI in the last 168 hours.  HbA1C: No results found for: HGBA1C  CBG: Recent Labs  Lab 08/30/19 2106 08/27/2019 0714 08/24/2019 0842 09/06/2019 1339 08/13/2019 1618  GLUCAP 97 67* 102* 128* 133*    Review of Systems:   Cannot obtain due to intubation  Past Medical History  He,  has a past medical history of COVID-19 virus infection, Diabetes mellitus without complication ([place]), High cholesterol, Hyperlipidemia, Hypertension, Iron deficiency anemia, and Renal disorder.   Surgical History    Past Surgical History:  Procedure Laterality Date  . BRAIN SURGERY    . INSERTION OF DIALYSIS CATHETER    . IR THORACENTESIS ASP PLEURAL SPACE W/IMG GUIDE  08/17/2019  . left hand surgery    . right knee repair       Social History   reports that  he has never smoked. He has never used smokeless tobacco. He reports that he does not drink alcohol or use drugs.   Family History   His family history includes Cancer in his mother.   Allergies No Known Allergies   Home Medications  Prior to Admission medications   Medication Sig Start Date End Date Taking? Authorizing Provider  acetaminophen (TYLENOL) 325 MG tablet Take 2 tablets (650 mg total) by mouth every 6 (six) hours as needed for mild pain (or Fever >/= 101). 09/02/2019   Awilda Bill, NP  acetaminophen (TYLENOL) 650 MG suppository Place 1 suppository (650 mg total) rectally every 6 (six) hours as needed for mild pain (or Fever >/= 101). 08/16/2019   Awilda Bill, NP  ceFEPIme 2 g in sodium chloride 0.9 % 100 mL Inject 2 g into the vein every 12 (twelve) hours. 08/18/2019   Awilda Bill, NP  Chlorhexidine Gluconate Cloth 2 % PADS Apply 6 each topically daily. 2019/09/24   Awilda Bill, NP  chlorhexidine  gluconate, MEDLINE KIT, (PERIDEX) 0.12 % solution 15 mLs by Mouth Rinse route 2 (two) times daily. 08/27/2019   Awilda Bill, NP  DOPamine (INTROPIN) 3.2-5 MG/ML-% infusion Inject 0-2,006 mcg/min into the vein continuous. 09/04/2019   Awilda Bill, NP  fentaNYL (SUBLIMAZE) 100 MCG/2ML injection Inject 0.5 mLs (25 mcg total) into the vein every 15 (fifteen) minutes as needed (to achieve RASS & CPOT goal.). 08/29/2019   Awilda Bill, NP  fentaNYL (SUBLIMAZE) 100 MCG/2ML injection Inject 0.5-2 mLs (25-100 mcg total) into the vein every 30 (thirty) minutes as needed (to maintain RASS & CPOT goal.). 09/04/2019   Awilda Bill, NP  heparin 1000 UNIT/ML injection Inject 1-6 mLs (1,000-6,000 Units total) into the vein as needed (to fill dialysis catheter). 08/14/2019   Awilda Bill, NP  heparin 25000-0.45 UT/250ML-% infusion Inject 1,400 Units/hr into the vein continuous. 08/30/2019   Awilda Bill, NP  insulin aspart (NOVOLOG) 100 UNIT/ML injection Inject 0-5 Units into the skin at bedtime. 09/02/2019   Awilda Bill, NP  insulin aspart (NOVOLOG) 100 UNIT/ML injection Inject 0-9 Units into the skin 3 (three) times daily with meals. 08/19/2019   Awilda Bill, NP  midazolam (VERSED) 2 MG/2ML SOLN injection Inject 1 mL (1 mg total) into the vein every 15 (fifteen) minutes as needed (to achieve RASS goal). 09/02/2019   Awilda Bill, NP  midazolam (VERSED) 2 MG/2ML SOLN injection Inject 1 mL (1 mg total) into the vein every 2 (two) hours as needed (to maintain RASS goal.). 09/05/2019   Awilda Bill, NP  norepinephrine (LEVOPHED) 16-5 MG/250ML-% SOLN Inject 0-40 mcg/min into the vein continuous. 08/28/2019   Awilda Bill, NP  patiromer (VELTASSA) 8.4 g packet Take 2 packets (16.8 g total) by mouth daily. 24-Sep-2019   Awilda Bill, NP  polyethylene glycol (MIRALAX / GLYCOLAX) 17 g packet Take 17 g by mouth daily as needed for mild constipation. 08/26/2019   Awilda Bill, NP  propofol (DIPRIVAN) 1000  MG/100ML EMUL injection Inject 501.5-8,024 mcg/min into the vein continuous. 08/09/2019   Awilda Bill, NP     Critical care time: 40 minutes       Roselie Awkward, MD [place] [facility] Pager: [phone] Cell: [phone] If no response, call [phone]

## 2019-08-31 NOTE — Discharge Summary (Addendum)
Physician Discharge Summary  Patient ID: Theodore Nguyen MRN: [id number] DOB/AGE: 72-Jul-1947 72 y.o.  Admit date: 08/30/2019 Discharge date: 08/10/2019               DISCHARGE SUMMARY   Theodore Nguyen is a 73 y.o. y/o male with a PMH of end-stage renal disease on hemodialysis, hypertension, hyperlipidemia, type 2 diabetes, iron deficiency anemia, and recent COVID-19 infection with hospitalization at [facility] from 9/8-9/18 who presents to [facility] ED on 08/30/2019 from outpatient hemodialysis with hypotension, shortness of breath, and cough. In the ER pt sbp 70-80's, he received 500 ml fluid bolus, midodrine, and levophed gtt initiated.  CXR concerning for multifocal pneumonia with superimposed CHF and bilateral pulmonary effusions (left greater than right).  IV azithromycin and ceftriaxone administered.  Pts O2 sats on 2L via nasal canula 100%.  Lab results revealed K+ 6.0, creatinine 5.19, lactic acid 0.7, wbc 6.5, hgb 8.5, and COVID-19 PCR positive.  Hyperkalemia treated with medications and nephrology consulted.  Due to lack of bed availability at [facility] pt admitted to [facility] stepdown unit for additional workup and treatment.  Upon arrival to the stepdown unit pts respiratory status stable on 2L O2 via nasal canula.  Per nephrology recommendations left femoral trialysis catheter placed and CVVHD initiated.  On 08/14/2019 pt became hypoxic O2 sats low 80's, unresponsive, and pulseless (PEA).  Code blue called with ROSC 4-5 minutes following ACLS protocol (1 epi given) and post mechanical intubation. Upon bed availability pt transferring to [facility] accepting physician Dr. Roselie Awkward.  SIGNIFICANT DIAGNOSTIC STUDIES/SIGNIFICANT EVENTS 09/22-Pt admitted to [facility] stepdown unit  09/23-Temporary left femoral trialysis catheter placed and CVVHD initiated (2K bath Therapy fluid rate 2.5L/hr/Blood flow rate 300 ml/hr/UF rate 0 ml/hr) 09/23-Pt PEA arrested likely secondary to  respiratory arrest and mechanically intubated   MICRO DATA  COVID-19 09/22>>positive  Blood x2 09/23>>  ANTIBIOTICS Ceftriaxone 09/23>>09/23 Azithromycin 09/23>>09/23 Cefepime 09/23>>  CONSULTS Nephrology  Intensivist    COVID-19 DISASTER DECLARATION: FULL CONTACT PHYSICAL EXAMINATION WAS NOT POSSIBLE DUE TO TREATMENT OF COVID-19 AND CONSERVATION OF PERSONAL PROTECTIVE EQUIPMENT, LIMITED EXAM FINDINGS Discharge Exam: General: acutely ill appearing male, NAD mechanically intubated Neuro: sedated  Cardiovascular: nsr, rrr, no R/G Lungs: mechanically intubated PRVC 20/TV 500/PEEP 5/FiO2 50%, O2 sats 100   Vitals:   08/27/2019 1400 09/07/2019 1408 08/09/2019 1500 08/18/2019 1618  BP: (!) 52/39  (!) 85/52   Pulse: (!) 53  62   Resp: 20  20   Temp:      TempSrc:      SpO2:  98% 100% 100%  Weight:      Height:         Discharge Labs  BMET Recent Labs  Lab 08/30/19 1409 08/27/2019 0502 08/11/2019 1138 08/10/2019 1356 08/26/2019 1357  NA 139 140 139 137 137  K 6.0* 5.0 4.6 3.5 3.5  CL 101 105 105 101 101  CO2 _0 GLUCOSE 92 94 134* 116* 119*  BUN 68* 61* 51* 33* 33*  CREATININE 5.19* 4.54* 3.61* 2.30* 2.28*  CALCIUM 8.7* 8.7* 8.7* 8.7* 8.7*  MG  --  1.9 1.9 1.8  --   PHOS  --   --  4.4 2.8  --     CBC Recent Labs  Lab 08/30/19 1409 08/21/2019 0502 08/12/2019 1357  HGB 8.5* 8.8* 8.0*  HCT 29.4* 31.0* 29.0*  WBC 6.5 4.7 12.0*  PLT 173 156 172    Anti-Coagulation Recent Labs  Lab  09/04/2019 0502  INR 1.2          Allergies as of 08/12/2019   No Known Allergies     Medication List    STOP taking these medications   amLODipine 5 MG tablet Commonly known as: NORVASC   aspirin EC 81 MG tablet   atorvastatin 40 MG tablet Commonly known as: LIPITOR   b complex-vitamin c-folic acid 0.8 MG Tabs tablet   carvedilol 6.25 MG tablet Commonly known as: COREG   feeding supplement (NEPRO CARB STEADY) Liqd   gabapentin 100 MG capsule Commonly known  as: NEURONTIN   guaiFENesin-dextromethorphan 100-10 MG/5ML syrup Commonly known as: ROBITUSSIN DM   isosorbide mononitrate 30 MG 24 hr tablet Commonly known as: IMDUR   multivitamin with minerals Tabs tablet   omeprazole 40 MG capsule Commonly known as: PRILOSEC   ondansetron 4 MG tablet Commonly known as: ZOFRAN   sertraline 25 MG tablet Commonly known as: ZOLOFT   Tylenol 8 Hour 650 MG CR tablet Generic drug: acetaminophen Replaced by: acetaminophen 325 MG tablet   Vitamin D3 50 MCG (2000 UT) Tabs     TAKE these medications   acetaminophen 325 MG tablet Commonly known as: TYLENOL Take 2 tablets (650 mg total) by mouth every 6 (six) hours as needed for mild pain (or Fever >/= 101). Replaces: Tylenol 8 Hour 650 MG CR tablet   acetaminophen 650 MG suppository Commonly known as: TYLENOL Place 1 suppository (650 mg total) rectally every 6 (six) hours as needed for mild pain (or Fever >/= 101).   ceFEPIme 2 g in sodium chloride 0.9 % 100 mL Inject 2 g into the vein every 12 (twelve) hours.   chlorhexidine gluconate (MEDLINE KIT) 0.12 % solution Commonly known as: PERIDEX 15 mLs by Mouth Rinse route 2 (two) times daily.   Chlorhexidine Gluconate Cloth 2 % Pads Apply 6 each topically daily. Start taking on: September 03, 2019   DOPamine 3.2-5 MG/ML-% infusion Commonly known as: INTROPIN Inject 0-2,006 mcg/min into the vein continuous.   fentaNYL 100 MCG/2ML injection Commonly known as: SUBLIMAZE Inject 0.5 mLs (25 mcg total) into the vein every 15 (fifteen) minutes as needed (to achieve RASS & CPOT goal.).   fentaNYL 100 MCG/2ML injection Commonly known as: SUBLIMAZE Inject 0.5-2 mLs (25-100 mcg total) into the vein every 30 (thirty) minutes as needed (to maintain RASS & CPOT goal.).   heparin 1000 UNIT/ML injection Inject 1-6 mLs (1,000-6,000 Units total) into the vein as needed (to fill dialysis catheter).   heparin 25000-0.45 UT/250ML-% infusion Inject  1,400 Units/hr into the vein continuous.   insulin aspart 100 UNIT/ML injection Commonly known as: novoLOG Inject 0-5 Units into the skin at bedtime.   insulin aspart 100 UNIT/ML injection Commonly known as: novoLOG Inject 0-9 Units into the skin 3 (three) times daily with meals.   midazolam 2 MG/2ML Soln injection Commonly known as: VERSED Inject 1 mL (1 mg total) into the vein every 15 (fifteen) minutes as needed (to achieve RASS goal).   midazolam 2 MG/2ML Soln injection Commonly known as: VERSED Inject 1 mL (1 mg total) into the vein every 2 (two) hours as needed (to maintain RASS goal.).   norepinephrine 16-5 MG/250ML-% Soln Commonly known as: LEVOPHED Inject 0-40 mcg/min into the vein continuous.   patiromer 8.4 g packet Commonly known as: VELTASSA Take 2 packets (16.8 g total) by mouth daily. Start taking on: 03-Sep-2019   polyethylene glycol 17 g packet Commonly known as: MIRALAX /  GLYCOLAX Take 17 g by mouth daily as needed for mild constipation.   propofol 1000 MG/100ML Emul injection Commonly known as: DIPRIVAN Inject 501.5-8,024 mcg/min into the vein continuous.        Disposition: Transfer [place], [place] Pager [phone] (please enter 7 digits) PCCM Consult Pager [phone] (please enter 7 digits)

## 2019-08-31 NOTE — Progress Notes (Signed)
ANTICOAGULATION CONSULT NOTE - Initial Consult  Pharmacy Consult for heparin Indication: VTE prophylaxis s/t COVID + and elevated D-Dimer  No Known Allergies  Patient Measurements: Height: 6' (182.9 cm) Weight: 221 lb 1.9 oz (100.3 kg) IBW/kg (Calculated) : 77.6 Heparin Dosing Weight: 98 kg  Vital Signs: Temp: 97.8 F (36.6 C) (09/22 2103) Temp Source: Oral (09/22 2103) BP: 88/53 (09/23 0500) Pulse Rate: 61 (09/23 0500)  Labs: Recent Labs    08/30/19 1409 08/27/2019 0502  HGB 8.5* 8.8*  HCT 29.4* 31.0*  PLT 173 156  CREATININE 5.19* 4.54*    Estimated Creatinine Clearance: 18 mL/min (A) (by C-G formula based on SCr of 4.54 mg/dL (H)).   Medical History: Past Medical History:  Diagnosis Date  . COVID-19 virus infection   . Diabetes mellitus without complication ([place])   . High cholesterol   . Hyperlipidemia   . Hypertension   . Iron deficiency anemia   . Renal disorder    dialysis    Medications:  Scheduled:  . aspirin EC  81 mg Oral Daily  . atorvastatin  40 mg Oral Daily  . Chlorhexidine Gluconate Cloth  6 each Topical Daily  . cholecalciferol  2,000 Units Oral Daily  . feeding supplement (NEPRO CARB STEADY)  237 mL Oral BID BM  . gabapentin  100 mg Oral TID  . insulin aspart  0-5 Units Subcutaneous QHS  . insulin aspart  0-9 Units Subcutaneous TID WC  . midodrine  10 mg Oral TID WC  . multivitamin  1 tablet Oral QHS  . multivitamin with minerals  1 tablet Oral Daily  . pantoprazole  40 mg Oral Daily  . patiromer  16.8 g Oral Daily  . sertraline  25 mg Oral Daily    Assessment: Patient admitting for hypotension and COVID + and elevated D-Dimer, slightly elevated fibrin and ferritin is being started on heparin drip for VTE prophylaxis in setting of COVID+  Goal of Therapy:  Heparin level 0.3-0.7 units/ml Monitor platelets by anticoagulation protocol: Yes   Plan:  Will start heparin drip w/o bolus since patient had received heparin subq 5000 units  at 09/22 2230. Will start rate at 1400 units/hr. Baseline aPTT/INR pending, baseline and f/u H/h are low will continue to monitor. Will check anti-Xa @ 1400.  Tobie Lords, PharmD, BCPS Clinical Pharmacist 08/11/2019,6:39 AM

## 2019-08-31 NOTE — Progress Notes (Signed)
Pt is COVID positive .  Getting transferred to [facility].  Have not seen the patient as patient has been seen n followed by intensivist

## 2019-08-31 NOTE — Progress Notes (Addendum)
Initial Nutrition Assessment  DOCUMENTATION CODES:   Not applicable  INTERVENTION:  Continue Nepro Shake po BID, each supplement provides 425 kcal and 19 grams protein.  Continue Rena-vite QHS.  Will discontinue the regular MVI.  NUTRITION DIAGNOSIS:   Increased nutrient needs related to catabolic [id number] infection, ESRD on HD) as evidenced by estimated needs.  GOAL:   Patient will meet greater than or equal to 90% of their needs  MONITOR:   PO intake, Supplement acceptance, Labs, Weight trends, Skin, I & O's  REASON FOR ASSESSMENT:   Malnutrition Screening Tool    ASSESSMENT:   73 year old male with PMHx of HTN, HLD, DM, iron deficiency anemia who was recently treated for COVID-19 infection at [facility] 9/8-9/18 and was then discharged to SNF now admitted with hypotension and acute hypoxic respiratory failure secondary to COVID-19 infection, multifocal PNA, pulmonary edema, and bilateral pleural effusions.   Unable to enter room as patient is on airborne and contact precautions for COVID-19 infection. Attempted to call patient over the phone for nutrition/weight history but he was unable to answer. No meal completion recorded in chart at this time but per discussion on rounds patient is eating well. Patient was followed by RD during recent admission and he was eating 100% of meals at that time. He has increased nutrient needs. Patient is on CRRT.  Limited weight history in chart to trend.  Medications reviewed and include: vitamin D3 2000 units daily, gabapentin, Novolog 0-9 units TID, Novolog 0-5 units QHS, Rena-vite QHS, MVI daily, pantoprazole, sertraline, azithromycin, ceftriaxone, norepinephrine gtt at 5 mcg/min.  Labs reviewed: CBG 67-102, BUN 61, Creatinine 4.54, BNP 253, Ferritin 963, CRP 17.5.  Discussed with RN and on rounds.  NUTRITION - FOCUSED PHYSICAL EXAM:  Unable to complete at this time.  Diet Order:   Diet Order            Diet  renal/carb modified with fluid restriction Diet-HS Snack? Nothing; Fluid restriction: 1200 mL Fluid; Room service appropriate? Yes; Fluid consistency: Thin  Diet effective now             EDUCATION NEEDS:   No education needs have been identified at this time  Skin:  Skin Assessment: Skin Integrity Issues:(skin tear buttocks)  Last BM:  08/18/2019 - large type 2  Height:   Ht Readings from Last 1 Encounters:  08/30/19 6' (1.829 m)   Weight:   Wt Readings from Last 1 Encounters:  08/30/19 100.3 kg   Ideal Body Weight:  80.9 kg  BMI:  Body mass index is 29.99 kg/m.  Estimated Nutritional Needs:   Kcal:  [id number]  Protein:  >/= 130 grams on CRRT  Fluid:  UOP + 1 L  Willey Blade, MS, RD, LDN Office: [phone] Pager: [phone] After Hours/Weekend Pager: [phone]

## 2019-08-31 NOTE — Procedures (Signed)
Endotracheal Intubation: Patient required placement of an artificial airway secondary to Respiratory Failure  Consent: Emergent.   Hand washing performed prior to starting the procedure.   Medications administered for sedation prior to procedure:     A time out procedure was called and correct patient, name, & ID confirmed. Needed supplies and equipment were assembled and checked to include ETT, 10 ml syringe, Glidescope, Mac and Miller blades, suction, oxygen and bag mask valve, end tidal CO2 monitor.   Patient was positioned to align the mouth and pharynx to facilitate visualization of the glottis.   Heart rate, SpO2 and blood pressure was continuously monitored during the procedure. Pre-oxygenation was conducted prior to intubation and endotracheal tube was placed through the vocal cords into the trachea.     The artificial airway was placed under direct visualization via glidescope route using a 7.5 ETT on the first attempt.  ETT was secured at 23 cm mark.  Placement was confirmed by auscuitation of lungs with good breath sounds bilaterally and no stomach sounds.  Condensation was noted on endotracheal tube.   Pulse ox 98%.  CO2 detector in place with appropriate color change.   Complications: None .   Operator: Salomon Fick.   Chest radiograph ordered and pending.   Comments: OGT placed via glidescope.   Ottie Glazier, M.D.  Pulmonary & [place]

## 2019-08-31 NOTE — Progress Notes (Signed)
[facility] has arrived at beside to transfer pt to [facility], therefore will discontinue [place] Bilateral Lower Extremities to prevent delay in transfer.  Heparin gtt currently infusing.  Marda Stalker, [place] Pager [phone] (please enter 7 digits) PCCM Consult Pager [phone] (please enter 7 digits)

## 2019-08-31 NOTE — Progress Notes (Signed)
Patient ID: Theodore Nguyen, male   DOB: 17-Jan-1946, 73 y.o.   MRN: [id number]  Mr. Ficara was transferred earlier from [facility] for advanced level care in the setting of COVID-19 infection associated with severe sepsis.  Prior to his transfer, he was started on CRRT at that facility for management of multiple metabolic abnormalities in the setting of pressor dependent hypotension.  We recently took care of him while admitted to [facility] for management of his end-stage renal disease from where he was discharged to outpatient hemodialysis however, found to be hypotensive prompting admission to the hospital for additional work-up.  Earlier today, he suffered a PEA arrest.  [facility] Progress Note   Assessment/ Plan:   1.  COVID-19 infection/severe sepsis: Intubated and currently on pressors.  Additional management per CCM/primary service. 2. ESRD: Continue CRRT at this time due to hemodynamic instability with the goal to pull 50-100 cc of fluid per hour with physical exam findings consistent with volume overload/pulmonary edema. 3. Anemia: Secondary to anemia of chronic kidney disease as well as critical illness.  Will monitor for PRBC transfusion needs as well as resume ESA. 4. CKD-MBD: Anticipate he will have hypophosphatemia from ongoing CRRT losses, will continue to monitor this and replace accordingly.  Calcium level acceptable. 5. Nutrition: Resume enteral nutrition per CCM, okay to use nonrenal formula that will provide additional phosphorus and magnesium which will be lost in CRRT.  Caloric need calculation per pharmacy. 6.  VDRF: Status post PEA arrest.  Subjective:   Patient transferred from [facility] to [place] for advanced level critical care management and dedicated COVID-19 facility.   Objective:   There were no vitals taken for this visit.   Physical Exam: Physical exam findings reviewed with Dr. Cruzita Lederer of the [facility] hospitalist  service.  Labs: BMET Recent Labs  Lab 08/25/19 0448 08/26/19 0449 08/30/19 1409 08/19/2019 0502 09/03/2019 1138 08/30/2019 1356 08/23/2019 1357  NA 137 137 139 140 139 137 137  K 5.7* 4.6 6.0* 5.0 4.6 3.5 3.5  CL 101 102 101 105 105 101 101  CO2 23 24 26 28 29 27 27   GLUCOSE 94 111* 92 94 134* 116* 119*  BUN 71* 45* 68* 61* 51* 33* 33*  CREATININE 5.03* 3.81* 5.19* 4.54* 3.61* 2.30* 2.28*  CALCIUM 8.4* 8.4* 8.7* 8.7* 8.7* 8.7* 8.7*  PHOS  --   --   --   --  4.4 2.8  --    CBC Recent Labs  Lab 08/25/19 1512 08/30/19 1409 09/05/2019 0502 09/05/2019 1357  WBC 6.9 6.5 4.7 12.0*  NEUTROABS  --  5.8 4.3 10.9*  HGB 9.8* 8.5* 8.8* 8.0*  HCT 32.2* 29.4* 31.0* 29.0*  MCV 91.2 92.2 92.8 96.0  PLT 168 173 156 172    @[email or web address] @ Medications:      [name], MD 08/20/2019, 6:44 PM

## 2019-08-31 NOTE — Progress Notes (Signed)
[place] for heparin Indication: VTE prophylaxis s/t COVID + and elevated D-Dimer  No Known Allergies  Patient Measurements: Height: 6' (182.9 cm) Weight: 221 lb 1.9 oz (100.3 kg) IBW/kg (Calculated) : 77.6 Heparin Dosing Weight: 98 kg  Vital Signs: Temp: 97.9 F (36.6 C) (09/23 0800) Temp Source: Axillary (09/23 0800) BP: 88/52 (09/23 1000) Pulse Rate: 58 (09/23 1000)  Labs: Recent Labs    08/30/19 1409 09/06/2019 0502  HGB 8.5* 8.8*  HCT 29.4* 31.0*  PLT 173 156  APTT  --  61*  LABPROT  --  14.6  INR  --  1.2  CREATININE 5.19* 4.54*  TROPONINIHS  --  15    Estimated Creatinine Clearance: 18 mL/min (A) (by C-G formula based on SCr of 4.54 mg/dL (H)).   Medical History: Past Medical History:  Diagnosis Date  . COVID-19 virus infection   . Diabetes mellitus without complication ([place])   . High cholesterol   . Hyperlipidemia   . Hypertension   . Iron deficiency anemia   . Renal disorder    dialysis    Medications:  Scheduled:  . aspirin EC  81 mg Oral Daily  . atorvastatin  40 mg Oral Daily  . Chlorhexidine Gluconate Cloth  6 each Topical Daily  . cholecalciferol  2,000 Units Oral Daily  . feeding supplement (NEPRO CARB STEADY)  237 mL Oral BID BM  . gabapentin  100 mg Oral TID  . heparin injection (subcutaneous)  5,000 Units Subcutaneous Q8H  . insulin aspart  0-5 Units Subcutaneous QHS  . insulin aspart  0-9 Units Subcutaneous TID WC  . midodrine  10 mg Oral TID WC  . multivitamin  1 tablet Oral QHS  . multivitamin with minerals  1 tablet Oral Daily  . pantoprazole  40 mg Oral Daily  . patiromer  16.8 g Oral Daily  . sertraline  25 mg Oral Daily    Assessment: Patient admitted after being sent from outpatient HD for hypotension. Patient recently admitted at [facility] 9/8-9/18 with COVID-19 and is s/p 5 day course of Remdesivir. Labs upon admission were significant for elevated D-Dimer and slightly  elevated fibrin. Patient was noted to have left upper extremity swelling. He was empirically started on a heparin drip for VTE. 9/22 CXR significant for pulmonary edema and suspected multifocal pneumonia. 9/23 venous US of left upper extremity negative for DVT. 9/23 venous US of bilateral lower extremity ordered but not completed. Heparin drip was discontinued on rounds with radiology results negative for PE/DVT.   Patient with PEA cardiac arrest in early afternoon of 9/23 with ROSC and patient was intubated. Decision was made to resume heparin drip.   Plan:  -Resume heparin drip at previous rate of 1400 units/hr -Check HL in 8 hours @ 2230 -Follow daily CBC per protocol  [place] Resident 09/06/2019,12:07 PM

## 2019-08-31 NOTE — Progress Notes (Addendum)
ANTICOAGULATION CONSULT NOTE  Pharmacy Consult for heparin/vanc/cefepime Indication: COVID PNA/sepsis/rule out PE   No Known Allergies  Patient Measurements:   Heparin Dosing Weight: 98 kg  Vital Signs: Temp: 97.6 F (36.4 C) (09/23 1200) Temp Source: Axillary (09/23 1200) BP: 121/71 (09/23 1640) Pulse Rate: 72 (09/23 1640)  Labs: Recent Labs    08/30/19 1409 08/23/2019 0502 08/30/2019 1138 09/05/2019 1356 08/12/2019 1357  HGB 8.5* 8.8*  --   --  8.0*  HCT 29.4* 31.0*  --   --  29.0*  PLT 173 156  --   --  172  APTT  --  61*  --   --   --   LABPROT  --  14.6  --   --   --   INR  --  1.2  --   --   --   CREATININE 5.19* 4.54* 3.61* 2.30* 2.28*  TROPONINIHS  --  15  --   --   --     Estimated Creatinine Clearance: 35.9 mL/min (A) (by C-G formula based on SCr of 2.28 mg/dL (H)).   Medical History: Past Medical History:  Diagnosis Date  . COVID-19 virus infection   . Diabetes mellitus without complication ([place])   . High cholesterol   . Hyperlipidemia   . Hypertension   . Iron deficiency anemia   . Renal disorder    dialysis    Medications:  Scheduled:    Assessment: Patient admitted after being sent from outpatient HD for hypotension. Patient recently admitted at [facility] 9/8-9/18 with COVID-19 and is s/p 5 day course of Remdesivir. Labs upon admission were significant for elevated D-Dimer and slightly elevated fibrin. Patient was noted to have left upper extremity swelling. He was empirically started on a heparin drip for VTE. 9/22 CXR significant for pulmonary edema and suspected multifocal pneumonia. 9/23 venous US of left upper extremity negative for DVT. 9/23 venous US of bilateral lower extremity ordered but not completed. Heparin drip was discontinued on rounds with radiology results negative for PE/DVT.   Patient with PEA cardiac arrest in early afternoon of 9/23 with ROSC and patient was intubated. Decision was made to resume heparin drip to r/o PE.  He was started on CRRT at [facility] and will continue here at [facility]    He got ceftriaxone and azith x1 at [facility]. Vanc/cefepime have been ordered here for sepsis/PNA.   Plan:   Increase heparin drip 1500 units/hr Check HL in 8 hours 0400 Follow daily CBC per protocol Vanc 2g IV x1 then 1g IV q24 Cefepime 2g IV q12 Levels as needed  Onnie Boer, PharmD, BCIDP, AAHIVP, CPP Infectious Disease Pharmacist 09/07/2019 6:36 PM

## 2019-08-31 NOTE — Progress Notes (Signed)
[facility]  ROUNDING NOTE   Subjective:  Patient just started outpatient hemodialysis with [place] for COVID-19. However found to be hypotensive upon initial evaluation. History obtained through chart review and discussion with physicians. Case was discussed late yesterday with NP. Temporary dialysis catheter was placed early this a.m. Patient has been started on CRRT. Potassium has come down. Patient was recently admitted to [facility] for COVID-19 infection.   Objective:  Vital signs in last 24 hours:  Temp:  [97.8 F (36.6 C)-97.9 F (36.6 C)] 97.9 F (36.6 C) (09/23 0800) Pulse Rate:  [57-71] 58 (09/23 1000) Resp:  [9-17] 17 (09/23 1000) BP: (68-99)/(47-64) 88/52 (09/23 1000) SpO2:  [92 %-100 %] 100 % (09/22 2103) Weight:  [98.4 kg-100.3 kg] 100.3 kg (09/22 2103)  Weight change:  Filed Weights   08/30/19 1353 08/30/19 2103  Weight: 98.4 kg 100.3 kg    Intake/Output: I/O last 3 completed shifts: In: 667.6 [I.V.:117.6; IV Piggyback:550] Out: 0    Intake/Output this shift:  Total I/O In: 344.7 [I.V.:94.3; IV Piggyback:250.5] Out: 0   Physical Exam: Patient not examined directly given COVID-19 + status, utilizing exam of the primary team and observations of RN's.  Basic Metabolic Panel: Recent Labs  Lab 08/25/19 0448 08/26/19 0449 08/30/19 1409 08/25/2019 0502  NA 137 137 139 140  K 5.7* 4.6 6.0* 5.0  CL 101 102 101 105  CO2 23 24 26 28   GLUCOSE 94 111* 92 94  BUN 71* 45* 68* 61*  CREATININE 5.03* 3.81* 5.19* 4.54*  CALCIUM 8.4* 8.4* 8.7* 8.7*  MG  --   --   --  1.9    Liver Function Tests: Recent Labs  Lab 08/30/19 1409 08/12/2019 0502  AST 11* 10*  ALT 11 11  ALKPHOS 63 62  BILITOT 0.8 0.8  PROT 5.9* 5.7*  ALBUMIN 2.2* 2.2*   No results for input(s): LIPASE, AMYLASE in the last 168 hours. No results for input(s): AMMONIA in the last 168 hours.  CBC: Recent Labs  Lab 08/25/19 1512 08/30/19 1409 08/30/2019 0502  WBC 6.9  6.5 4.7  NEUTROABS  --  5.8 4.3  HGB 9.8* 8.5* 8.8*  HCT 32.2* 29.4* 31.0*  MCV 91.2 92.2 92.8  PLT 168 173 156    Cardiac Enzymes: No results for input(s): CKTOTAL, CKMB, CKMBINDEX, TROPONINI in the last 168 hours.  BNP: Invalid input(s): POCBNP  CBG: Recent Labs  Lab 08/30/19 2106 09/04/2019 0714 08/22/2019 0842  GLUCAP 97 67* 102*    Microbiology: Results for orders placed or performed during the hospital encounter of 08/30/19  SARS Coronavirus 2 [facility] order, Performed in [facility] hospital lab) Nasopharyngeal Nasopharyngeal Swab     Status: Abnormal   Collection Time: 08/30/19  2:27 PM   Specimen: Nasopharyngeal Swab  Result Value Ref Range Status   SARS Coronavirus 2 POSITIVE (A) NEGATIVE Final    Comment: RESULT CALLED TO, READ BACK BY AND VERIFIED WITH: MITCH BARKER AT 1556 ON 08/30/2019 [place]. (NOTE) If result is NEGATIVE SARS-CoV-2 target nucleic acids are NOT DETECTED. The SARS-CoV-2 RNA is generally detectable in upper and lower  respiratory specimens during the acute phase of infection. The lowest  concentration of SARS-CoV-2 viral copies this assay can detect is 250  copies / mL. A negative result does not preclude SARS-CoV-2 infection  and should not be used as the sole basis for treatment or other  patient management decisions.  A negative result may occur with  improper specimen collection /  handling, submission of specimen other  than nasopharyngeal swab, presence of viral mutation(s) within the  areas targeted by this assay, and inadequate number of viral copies  (<250 copies / mL). A negative result must be combined with clinical  observations, patient history, and epidemiological information. If result is POSITIVE SARS-CoV-2 target nucleic acids are DETECTED.  The SARS-CoV-2 RNA is generally detectable in upper and lower  respiratory specimens during the acute phase of infection.  Positive  results are indicative of active infection with  SARS-CoV-2.  Clinical  correlation with patient history and other diagnostic information is  necessary to determine patient infection status.  Positive results do  not rule out bacterial infection or co-infection with other viruses. If result is PRESUMPTIVE POSTIVE SARS-CoV-2 nucleic acids MAY BE PRESENT.   A presumptive positive result was obtained on the submitted specimen  and confirmed on repeat testing.  While 2019 novel coronavirus  (SARS-CoV-2) nucleic acids may be present in the submitted sample  additional confirmatory testing may be necessary for epidemiological  and / or clinical management purposes  to differentiate between  SARS-CoV-2 and other Sarbecovirus currently known to infect humans.  If clinically indicated additional testing with an alternate test  methodology [phone])  is advised. The SARS-CoV-2 RNA is generally  detectable in upper and lower respiratory specimens during the acute  phase of infection. The expected result is Negative. Fact Sheet for Patients:  StrictlyIdeas.no Fact Sheet for Healthcare Providers: BankingDealers.co.za This test is not yet approved or cleared by the [place] FDA and has been authorized for detection and/or diagnosis of SARS-CoV-2 by FDA under an Emergency Use Authorization (EUA).  This EUA will remain in effect (meaning this test can be used) for the duration of the COVID-19 declaration under Section 564(b)(1) of the Act, 21 U.S.C. section 360bbb-3(b)(1), unless the authorization is terminated or revoked sooner. Performed at [facility], [address] [place], [place] [address]   MRSA PCR Screening     Status: None   Collection Time: 08/30/19  9:20 PM   Specimen: Nasopharyngeal  Result Value Ref Range Status   MRSA by PCR NEGATIVE NEGATIVE Final    Comment:        The GeneXpert MRSA Assay (FDA approved for NASAL specimens only), is one component of  a comprehensive MRSA colonization surveillance program. It is not intended to diagnose MRSA infection nor to guide or monitor treatment for MRSA infections. Performed at [facility], [address] [place], [place] [address]     Coagulation Studies: Recent Labs    09/06/2019 0502  LABPROT 14.6  INR 1.2    Urinalysis: No results for input(s): COLORURINE, LABSPEC, PHURINE, GLUCOSEU, HGBUR, BILIRUBINUR, KETONESUR, PROTEINUR, UROBILINOGEN, NITRITE, LEUKOCYTESUR in the last 72 hours.  Invalid input(s): APPERANCEUR    Imaging: US Venous Img Upper Uni Left  Result Date: 08/27/2019 CLINICAL DATA:  Left arm swelling. COVID-19 positive. EXAM: LEFT UPPER EXTREMITY VENOUS DOPPLER ULTRASOUND TECHNIQUE: Gray-scale sonography with graded compression, as well as color Doppler and duplex ultrasound were performed to evaluate the upper extremity deep venous system from the level of the subclavian vein and including the jugular, axillary, basilic, radial, ulnar and upper cephalic vein. Spectral Doppler was utilized to evaluate flow at rest and with distal augmentation maneuvers. COMPARISON:  None. FINDINGS: Contralateral Subclavian Vein: Respiratory phasicity is normal and symmetric with the symptomatic side. No evidence of thrombus. Normal compressibility. Internal Jugular Vein: No evidence of thrombus. Normal compressibility, respiratory phasicity and response to  augmentation. Subclavian Vein: No evidence of thrombus. Normal compressibility, respiratory phasicity and response to augmentation. Axillary Vein: No evidence of thrombus. Normal compressibility, respiratory phasicity and response to augmentation. Cephalic Vein: No evidence of thrombus. Normal compressibility, respiratory phasicity and response to augmentation. Basilic Vein: No evidence of thrombus. Normal compressibility, respiratory phasicity and response to augmentation. Brachial Veins: No evidence of thrombus. Normal  compressibility, respiratory phasicity and response to augmentation. Radial Veins: No evidence of thrombus. Normal compressibility, respiratory phasicity and response to augmentation. Ulnar Veins: No evidence of thrombus. Normal compressibility, respiratory phasicity and response to augmentation. Venous Reflux:  None visualized. Other Findings:  Subcutaneous edema noted in the forearm. IMPRESSION: No evidence of DVT within the left upper extremity. Electronically Signed   By: Keith Rake M.D.   On: 09/07/2019 00:55   Dg Chest Portable 1 View  Result Date: 08/30/2019 CLINICAL DATA:  Shortness of breath. Renal failure. Recent COVID-19 positive EXAM: PORTABLE CHEST 1 VIEW COMPARISON:  August 17, 2019 FINDINGS: There is cardiomegaly with pulmonary venous hypertension. There are pleural effusions bilaterally, significantly larger on the left than on the right. Pleural effusion appears at least partially loculated on the left. There is patchy airspace opacity throughout the right lower lung zone and in the left upper lobe, new in the right base and increased in the left upper lobe. There is consolidation in the left base medially. No adenopathy.  There is aortic atherosclerosis.  No bone lesions. IMPRESSION: Suspect multifocal pneumonia superimposed on pulmonary vascular congestion and likely congestive heart failure. It is possible that all of the changes present are due to congestive heart failure, although the appearance is more suggestive of a combination of pneumonia and edema. No adenopathy. Aortic Atherosclerosis (ICD10-I70.0). Electronically Signed   By: Lowella Grip III M.D.   On: 08/30/2019 15:07     Medications:   . azithromycin 250 mL/hr at 09/06/2019 1000  . cefTRIAXone (ROCEPHIN)  IV Stopped (08/11/2019 0919)  . heparin 1,400 Units/hr (08/29/2019 1000)  . norepinephrine (LEVOPHED) Adult infusion 5 mcg/min (09/06/2019 1000)  . pureflow 3 each (09/07/2019 0930)   . aspirin EC  81 mg Oral  Daily  . atorvastatin  40 mg Oral Daily  . Chlorhexidine Gluconate Cloth  6 each Topical Daily  . cholecalciferol  2,000 Units Oral Daily  . feeding supplement (NEPRO CARB STEADY)  237 mL Oral BID BM  . gabapentin  100 mg Oral TID  . insulin aspart  0-5 Units Subcutaneous QHS  . insulin aspart  0-9 Units Subcutaneous TID WC  . midodrine  10 mg Oral TID WC  . multivitamin  1 tablet Oral QHS  . multivitamin with minerals  1 tablet Oral Daily  . pantoprazole  40 mg Oral Daily  . patiromer  16.8 g Oral Daily  . sertraline  25 mg Oral Daily   acetaminophen **OR** acetaminophen, heparin, ondansetron **OR** ondansetron (ZOFRAN) IV, polyethylene glycol  Assessment/ Plan:  73 y.o. male with past medical history of ESRD on HD TTS, hypertension, hyperlipidemia, recent COVID-19 infection, bilateral pleural effusions who presented with hypotension.  [facility]  1.  ESRD on HD TTS.  Temporary left femoral dialysis catheter was placed early this a.m.  Patient has been initiated on CRRT.  Tolerating well thus far.  Okay to discontinue for transport to COVID-19 inpatient facility.  2.  Hyperkalemia.  Serum potassium was 6.0 upon admission.  Now down with CRRT.  Continue to monitor serum potassium.  3.  Hypotension.  Continue  norepinephrine to maintain a map of 65 or greater.  Patient also on midodrine 10 mg 3 times daily.  4.  Anemia of chronic kidney disease.  Hemoglobin currently 8.8.  Retacrit could be considered upon patient transfer.  5.  Secondary hyperparathyroidism.  Recommend periodic monitoring of PTH, calcium, and phosphorus.   LOS: 1 Keath Matera 9/23/202010:16 AM

## 2019-09-01 ENCOUNTER — Encounter (HOSPITAL_COMMUNITY): Payer: Self-pay | Admitting: Pharmacist Clinician (PhC)/ Clinical Pharmacy Specialist

## 2019-09-01 LAB — POCT I-STAT 7, (LYTES, BLD GAS, ICA,H+H)
Acid-base deficit: 2 mmol/L (ref 0.0–2.0)
Bicarbonate: 21.6 mmol/L (ref 20.0–28.0)
Calcium, Ion: 1.22 mmol/L (ref 1.15–1.40)
HCT: 31 % — ABNORMAL LOW (ref 39.0–52.0)
Hemoglobin: 10.5 g/dL — ABNORMAL LOW (ref 13.0–17.0)
O2 Saturation: 87 %
Patient temperature: 35
Potassium: 4.3 mmol/L (ref 3.5–5.1)
Sodium: 137 mmol/L (ref 135–145)
TCO2: 23 mmol/L (ref 22–32)
pCO2 arterial: 30.9 mmHg — ABNORMAL LOW (ref 32.0–48.0)
pH, Arterial: 7.444 (ref 7.350–7.450)
pO2, Arterial: 46 mmHg — ABNORMAL LOW (ref 83.0–108.0)

## 2019-09-01 LAB — COMPREHENSIVE METABOLIC PANEL
ALT: 13 U/L (ref 0–44)
AST: 19 U/L (ref 15–41)
Albumin: 2.2 g/dL — ABNORMAL LOW (ref 3.5–5.0)
Alkaline Phosphatase: 70 U/L (ref 38–126)
Anion gap: 13 (ref 5–15)
BUN: 39 mg/dL — ABNORMAL HIGH (ref 8–23)
CO2: 23 mmol/L (ref 22–32)
Calcium: 8.4 mg/dL — ABNORMAL LOW (ref 8.9–10.3)
Chloride: 102 mmol/L (ref 98–111)
Creatinine, Ser: 2.71 mg/dL — ABNORMAL HIGH (ref 0.61–1.24)
GFR calc Af Amer: 26 mL/min — ABNORMAL LOW (ref >60)
GFR calc non Af Amer: 22 mL/min — ABNORMAL LOW (ref >60)
Glucose, Bld: 79 mg/dL (ref 70–99)
Potassium: 4.3 mmol/L (ref 3.5–5.1)
Sodium: 138 mmol/L (ref 135–145)
Total Bilirubin: 0.9 mg/dL (ref 0.3–1.2)
Total Protein: 5.8 g/dL — ABNORMAL LOW (ref 6.5–8.1)

## 2019-09-01 LAB — GLUCOSE, CAPILLARY
Glucose-Capillary: 74 mg/dL (ref 70–99)
Glucose-Capillary: 82 mg/dL (ref 70–99)
Glucose-Capillary: 96 mg/dL (ref 70–99)

## 2019-09-01 LAB — MAGNESIUM
Magnesium: 1.8 mg/dL (ref 1.7–2.4)
Magnesium: 1.8 mg/dL (ref 1.7–2.4)

## 2019-09-01 LAB — PHOSPHORUS
Phosphorus: 2.9 mg/dL (ref 2.5–4.6)
Phosphorus: 3.1 mg/dL (ref 2.5–4.6)

## 2019-09-01 LAB — PROTIME-INR
INR: 1.4 — ABNORMAL HIGH (ref 0.8–1.2)
Prothrombin Time: 17.3 seconds — ABNORMAL HIGH (ref 11.4–15.2)

## 2019-09-01 LAB — HEMOGLOBIN A1C
Hgb A1c MFr Bld: 5.1 % (ref 4.8–5.6)
Mean Plasma Glucose: 100 mg/dL

## 2019-09-01 LAB — CBC
HCT: 24.6 % — ABNORMAL LOW (ref 39.0–52.0)
Hemoglobin: 7.1 g/dL — ABNORMAL LOW (ref 13.0–17.0)
MCH: 26.7 pg (ref 26.0–34.0)
MCHC: 28.9 g/dL — ABNORMAL LOW (ref 30.0–36.0)
MCV: 92.5 fL (ref 80.0–100.0)
Platelets: 151 10*3/uL (ref 150–400)
RBC: 2.66 MIL/uL — ABNORMAL LOW (ref 4.22–5.81)
RDW: 19.3 % — ABNORMAL HIGH (ref 11.5–15.5)
WBC: 16.8 10*3/uL — ABNORMAL HIGH (ref 4.0–10.5)
nRBC: 0.2 % (ref 0.0–0.2)

## 2019-09-01 LAB — HEPARIN LEVEL (UNFRACTIONATED): Heparin Unfractionated: 0.9 IU/mL — ABNORMAL HIGH (ref 0.30–0.70)

## 2019-09-01 MED ORDER — ACETAMINOPHEN 325 MG PO TABS: 650.0000 mg | ORAL_TABLET | Freq: Four times a day (QID) | ORAL | Status: DC | PRN

## 2019-09-01 MED ORDER — SODIUM CHLORIDE 0.9 % IV SOLN
INTRAVENOUS | Status: DC | PRN
  Administered 2019-09-01: 250 mL via INTRAVENOUS

## 2019-09-01 MED ORDER — GLYCOPYRROLATE 1 MG PO TABS
1.0000 mg | ORAL_TABLET | ORAL | Status: DC | PRN
  Filled 2019-09-01: qty 1

## 2019-09-01 MED ORDER — FENTANYL 2500MCG IN NS 250ML (10MCG/ML) PREMIX INFUSION
0.0000 ug/h | INTRAVENOUS | Status: DC
  Administered 2019-09-01: 400 ug/h via INTRAVENOUS

## 2019-09-01 MED ORDER — ACETAMINOPHEN 650 MG RE SUPP: 650.0000 mg | Freq: Four times a day (QID) | RECTAL | Status: DC | PRN

## 2019-09-01 MED ORDER — GLYCOPYRROLATE 0.2 MG/ML IJ SOLN: 0.2000 mg | INTRAMUSCULAR | Status: DC | PRN

## 2019-09-01 MED ORDER — ACETAMINOPHEN 160 MG/5ML PO SOLN: 650.0000 mg | Freq: Four times a day (QID) | ORAL | Status: DC | PRN

## 2019-09-01 MED ORDER — POLYVINYL ALCOHOL 1.4 % OP SOLN
1.0000 [drp] | Freq: Four times a day (QID) | OPHTHALMIC | Status: DC | PRN
  Filled 2019-09-01: qty 15

## 2019-09-01 MED ORDER — SODIUM CHLORIDE 0.9 % IV SOLN
2.0000 g | Freq: Once | INTRAVENOUS | Status: AC
  Administered 2019-09-01: 2 g via INTRAVENOUS
  Filled 2019-09-01: qty 2

## 2019-09-01 MED ORDER — FENTANYL BOLUS VIA INFUSION
100.0000 ug | Freq: Once | INTRAVENOUS | Status: AC
  Administered 2019-09-01: 100 ug via INTRAVENOUS
  Filled 2019-09-01: qty 100

## 2019-09-01 MED ORDER — SODIUM CHLORIDE 0.9 % IV SOLN: 2.0000 g | Freq: Two times a day (BID) | INTRAVENOUS | Status: DC

## 2019-09-01 MED ORDER — EPINEPHRINE HCL 5 MG/250ML IV SOLN IN NS
0.5000 ug/min | INTRAVENOUS | Status: DC
  Administered 2019-09-01: 5 ug/min via INTRAVENOUS
  Filled 2019-09-01 (×2): qty 250

## 2019-09-01 MED ORDER — HEPARIN (PORCINE) 25000 UT/250ML-% IV SOLN
1300.0000 [IU]/h | INTRAVENOUS | Status: DC
  Administered 2019-09-01: 1300 [IU]/h via INTRAVENOUS

## 2019-09-01 MED ORDER — DIPHENHYDRAMINE HCL 50 MG/ML IJ SOLN: 25.0000 mg | INTRAMUSCULAR | Status: DC | PRN

## 2019-09-01 MED ORDER — DEXTROSE 5 % IV SOLN: INTRAVENOUS | Status: DC

## 2019-09-01 MED FILL — Medication: Qty: 1 | Status: AC

## 2019-09-02 LAB — HEMOGLOBIN A1C
Hgb A1c MFr Bld: 5.1 % (ref 4.8–5.6)
Mean Plasma Glucose: 100 mg/dL

## 2019-09-05 LAB — CULTURE, BLOOD (ROUTINE X 2)
Culture: NO GROWTH
Culture: NO GROWTH

## 2019-09-08 NOTE — Progress Notes (Signed)
35 ml of IV fentanyl wasted in the stericycle. Witnessed by Etta Quill Rn.

## 2019-09-08 NOTE — Progress Notes (Signed)
[facility] PCCM  I spoke to the patient's daughter, she understands the severity of his condition and requests that we remove life support.  She says that he has been very weak for over a year now and she doesn't want him to suffer anymore.  Comfort orders written, will withdraw care.  Roselie Awkward, MD [place] PCCM Pager: [phone] Cell: [phone] If no response, call [phone]

## 2019-09-08 NOTE — Progress Notes (Signed)
Called pt's daughter to updated pt status. Informed that the following belongings are at bedside: glasses, watch, cellphone, charger, hat, pants, shirt, jacket, socks and shoes. Pt's daughter requests to have these belongings sent over to funeral home with the pt and she will just pick them up from there. Pt's daughter appreciative of pt's care.

## 2019-09-08 NOTE — Progress Notes (Addendum)
[facility] PCCM  Brief progress note, full note to follow  Called emergently to bedside at 7:45 for cardiac arrest Has been on higher doses of dopamine overnight Had two separate PEA arrests this morning, total of 13 minutes. Has not showed signs of conciousness In the middle of CPR effort he had massive hemoptysis Based on echo from last night suspect this is a massive PE, cannot give TPA given hemoptysis.  Given his 3 cardiac arrests in last 24 hours, multiple comorbid illnesses, and baseline poor functional status he would not benefit from further CPR efforts.  Will write order for DNR.  Have discussed with Dr. Thereasa Solo who agrees.  I have tried to contact his family, had to leave a message for his daughter.  Will continue epinephrine infusion, mechanical ventilation and hold CVVHD given degree of shock.  CC time 60 minutes  Arterial line note:  Arterial Line Insertion Procedure Note [place] Rinke [id number] [birth year]  Procedure: Insertion of Arterial Line Indications: blood pressure monitoring  Procedure Details Consent: Unable to obtain consent because of emergent medical necessity. Time Out: Verified patient identification, verified procedure, site/side was marked, verified correct patient position, special equipment/implants available, medications/allergies/relevent history reviewed, required imaging and test results available.  Performed  Maximum sterile technique was used including antiseptics, cap, gloves, gown, hand hygiene, mask and sheet. Skin prep: Chlorhexidine; local anesthetic administered A single lumen arterial catheter was placed in the R femoral artery using the Seldinger technique.  Ultrasound was used to verify the patency of the vein and for real time needle guidance.  Evaluation Blood flow good Complications: No apparent complications Patient did tolerate procedure well.  Theodore Nguyen 09/30/19, 9:13 AM

## 2019-09-08 NOTE — Discharge Summary (Signed)
   Death Summary   Theodore Nguyen [id number] DOB: 30-May-1946 DOA: 09-19-2019  PCP: Eber Hong, MD  Admit date: September 19, 2019 Date of Death: 2019-09-20  Final Diagnoses:  Clinically suspected PE Volume overload ESRD (end stage renal disease)  Essential hypertension Type 2 diabetes mellitus with chronic kidney disease on chronic dialysis  Anemia due to chronic kidney disease COVID-19 virus infection Pleural effusion on left Hypotension / Shock    History of present illness:  73 year old with a history of ESRD on hemodialysis, HTN, HLD, and DM 2 who had been discharged from the hospital 08/26/2019 after being treated for COVID-19 with Remdesivir and dexamethasone.  He was discharged to a skilled nursing facility as he remained very weak and was severely deconditioned.  At the time however his oxygen saturation was 96% on room air.  On 9/22 he was sent to the [place] regional ED from his hemodialysis center after he was found to be hypotensive and short of breath.  In the ED he was found to have a blood pressure in the [id number] systolic.  A chest x-ray noted multifocal pneumonia with evidence of probable superimposed pulmonary edema.    Hospital Course:  The patient was initially admitted to the ICU at [facility].  CRRT was initiated as the patient was hyperkalemic.  The following day he became more hypoxic and hypotensive and suffered a PEA arrest.  He was coded for 4 to 5 minutes prior to ROSC.  He was intubated during the code and was subsequently transferred to [facility] 09/19/23.  He remained critically ill after his transfer to [facility] requiring pressor support.  CRRT and vent management continued.  At 7:45 AM on 09-20-23 the medical team was called to the bedside emergently for cardiac arrest.  The patient suffered 2 separate PEA arrest in the course of the morning totaling 13 minutes of downtime.  In the middle of the CPR efforts he had an episode of massive hemoptysis.  Echo results from a  study obtained at [facility] were suggestive of a very large PE.  TPA was not given because of the patient's hemoptysis.  Given his rapid persisting decline the patient's family was contacted.  His daughter confirmed that his quality of life had been on a significant downward spiral and that he would not wish to be kept alive in his current condition.  The medical team agreed fully.  Comfort measures were initiated and aggressive care was withdrawn.  The patient died peacefully at 11:08 AM   Signed:  Cherene Altes  [facility] 09-20-2019, 12:21 PM

## 2019-09-08 NOTE — Progress Notes (Signed)
RN called stating IV compatibility issue with running cefepime with other current IVs and the patient's two lines. Will give one dose of ceftazidime 2g in place of cefepime tonight as ceftazidime is compatible with all of the other current IV gtts. Discussed with Dr. Shanon Brow. Day team can consider additional line tomorrow.  Elenor Quinones, PharmD, BCPS, BCIDP Clinical Pharmacist September 03, 2019 1:38 AM

## 2019-09-08 NOTE — Progress Notes (Addendum)
0805: Pt went PEA. Code Blue called, CPR started. 2 doses of EPI given total.  0812: Rosc achieved. Dr. Lake Bells and Dr. Thereasa Solo at bedside to evaluate. Arterial line placed.  [id number]: Pt became PEA. CPR started. 1 dose of EPI given [id number].: Rosc achieved.

## 2019-09-08 NOTE — Progress Notes (Signed)
Theodore Nguyen for heparin Indication: Possible PE in the setting of COVID + and elevated D-Dimer  No Known Allergies  Patient Measurements: Weight: 229 lb 8 oz (104.1 kg) Heparin Dosing Weight: 98 kg  Vital Signs: Temp: 96.8 F (36 C) (09/24 0800) Temp Source: Esophageal (09/24 0400) BP: 45/30 (09/24 0800) Pulse Rate: 110 (09/24 0730)  Labs: Recent Labs    08/23/2019 0502  08/20/2019 1356 08/20/2019 1357 September 14, 2019 0423 Sep 14, 2019 0445  HGB 8.8*  --   --  8.0* 10.5* 7.1*  HCT 31.0*  --   --  29.0* 31.0* 24.6*  PLT 156  --   --  172  --  151  APTT 61*  --   --   --   --   --   LABPROT 14.6  --   --   --   --  17.3*  INR 1.2  --   --   --   --  1.4*  HEPARINUNFRC  --   --   --   --   --  0.90*  CREATININE 4.54*   < > 2.30* 2.28*  --  2.71*  TROPONINIHS 15  --   --   --   --   --    < > = values in this interval not displayed.    Estimated Creatinine Clearance: 30.7 mL/min (A) (by C-G formula based on SCr of 2.71 mg/dL (H)).   Medical History: Past Medical History:  Diagnosis Date  . COVID-19 virus infection   . Diabetes mellitus without complication ([place])   . High cholesterol   . Hyperlipidemia   . Hypertension   . Iron deficiency anemia   . Renal disorder    dialysis    Medications:  Scheduled:  . chlorhexidine gluconate (MEDLINE KIT)  15 mL Mouth Rinse BID  . Chlorhexidine Gluconate Cloth  6 each Topical Daily  . feeding supplement (PRO-STAT SUGAR FREE 64)  30 mL Per Tube BID  . feeding supplement (VITAL HIGH PROTEIN)  1,000 mL Per Tube Q24H  . fentaNYL (SUBLIMAZE) injection  25 mcg Intravenous Once  . insulin aspart  0-9 Units Subcutaneous Q4H  . mouth rinse  15 mL Mouth Rinse 10 times per day    Assessment: Patient admitted after being sent from outpatient HD for hypotension. Patient recently admitted at [facility] 9/8-9/18 with COVID-19 and is s/p 5 day course of Remdesivir. Labs upon admission were significant for  elevated D-Dimer and slightly elevated fibrin. Patient was noted to have left upper extremity swelling. On 9/22, he was empirically started on a heparin drip for VTE. 9/22 CXR significant for pulmonary edema and suspected multifocal pneumonia. 9/23 venous US of left upper extremity negative for DVT.   Patient had a cardiac arrest in the morning of 9/24. During code patient had significant amount of blood in his ET tube, heparin was held during code and for line placement. Heparin now resumed due to high possibility of PE, heparin level is AM is SUPRA-therapeutic at 0.9. Hbg trending low 8.0 to 7.1, plts wnl.    Plan:  -Resume heparin drip reduced to 1300 units/hr -Check HL in 8 hours @ 1730 -Follow daily CBC per protocol  Acey Lav, PharmD  PGY1 [place] Resident September 14, 2019,9:02 AM

## 2019-09-08 NOTE — Procedures (Signed)
[facility]  CPR performed for PEA arrest Total of 2 episodes, had ROSC x2 with extreme shock requiring epinephrine 4 rounds of epinephrine administered per ACLS protocol Family requests witdhrawal of care  Roselie Awkward, MD [facility] Pager: [phone] Cell: [phone] If no response, call [phone]

## 2019-09-08 NOTE — Procedures (Signed)
Extubation Procedure Note  Patient Details:   Name: Theodore Nguyen DOB: 10/23/1946 MRN: [id number]   Airway Documentation:    Vent end date: 09-22-19 Vent end time: 1055   Evaluation  Pt extubated to RA per Withdrawal of life protocol    Jesse Sans Sep 22, 2019, 11:59 AM

## 2019-09-08 NOTE — Progress Notes (Signed)
Pt expired at 20, pronounced by 2 RNs. Pt's daughter notified. Pt's daughter appreciative of update and care for the pt.

## 2019-09-08 DEATH — deceased

## 2020-10-20 IMAGING — CR DG CHEST 1V PORT
1 series · 1 of 1 positions shown · non-contrast
Comparison: 02/20/2017

CLINICAL DATA: Shortness of breath

EXAM:
PORTABLE CHEST 1 VIEW

[portable]
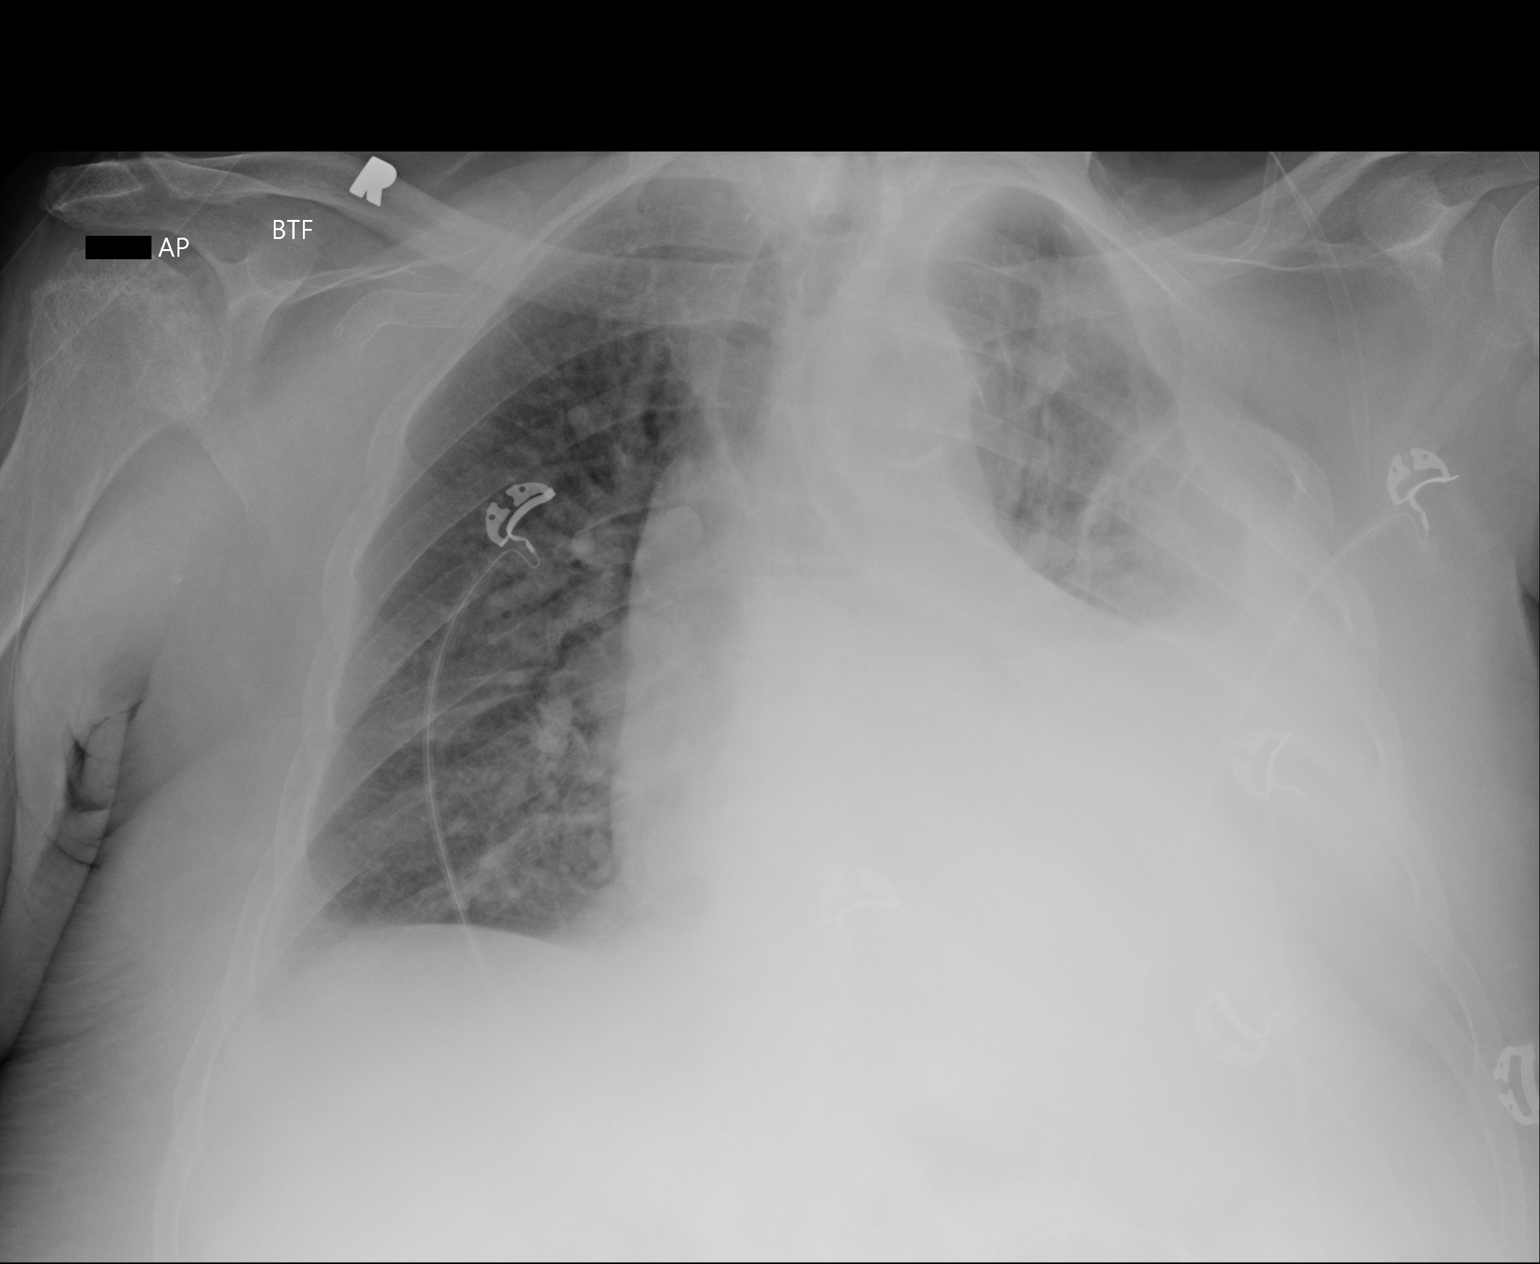

[1 of 1 positions shown; findings below may reference images not displayed]

FINDINGS: Cardiac shadow is enlarged but stable. Aortic calcifications are
noted. Large left-sided pleural effusion is noted with likely
underlying atelectasis/infiltrate. No bony abnormality is seen.
IMPRESSION: Large left-sided pleural effusion. Likely underlying
atelectasis/infiltrate is present.

## 2020-10-20 IMAGING — CT CT CHEST W/O CM
2 of 4 series · 15 of 36 positions shown, 18 images · non-contrast
Comparison: Radiographs of same day.

CLINICAL DATA: Chest pain, shortness of breath.

EXAM:
CT CHEST WITHOUT CONTRAST
TECHNIQUE: Multidetector CT imaging of the chest was performed following the
standard protocol without IV contrast.

[Series 2: routine chest without · axial · non-contrast · 0.74mm/px · z∈[-236,+36]mm · 12 of 158 slices shown, 15 images]
[im 11/158  mediastinal]
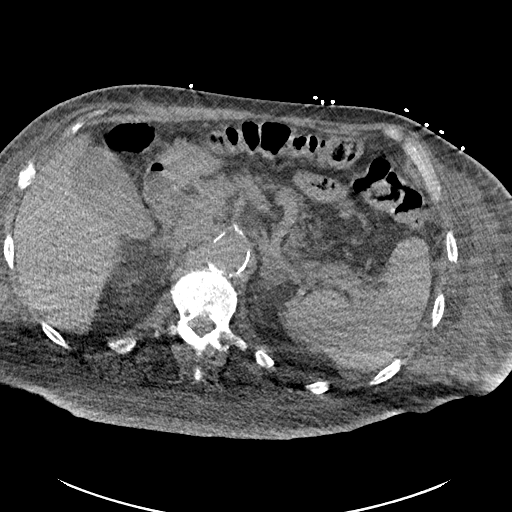
[im 11/158  lung]
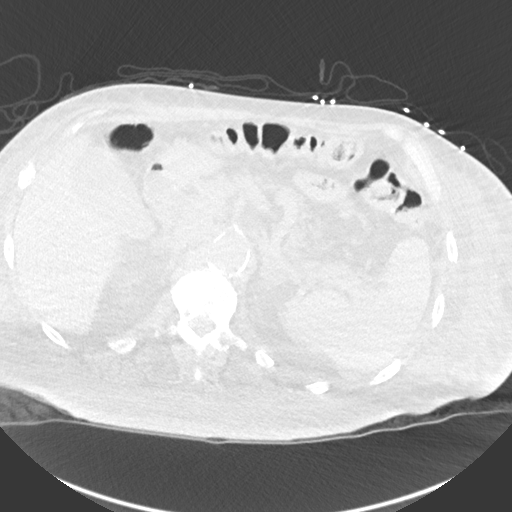
[im 21/158  lung]
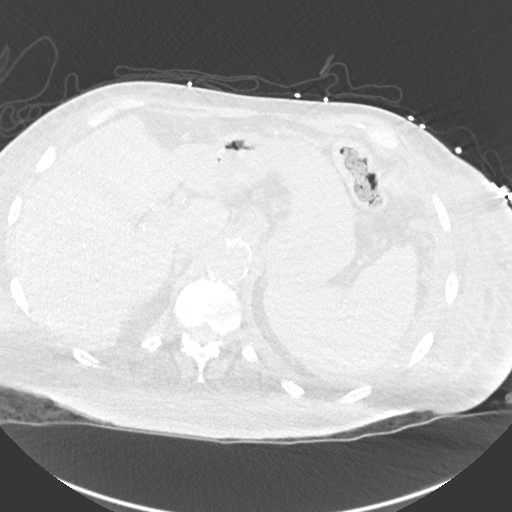
[im 32/158  lung]
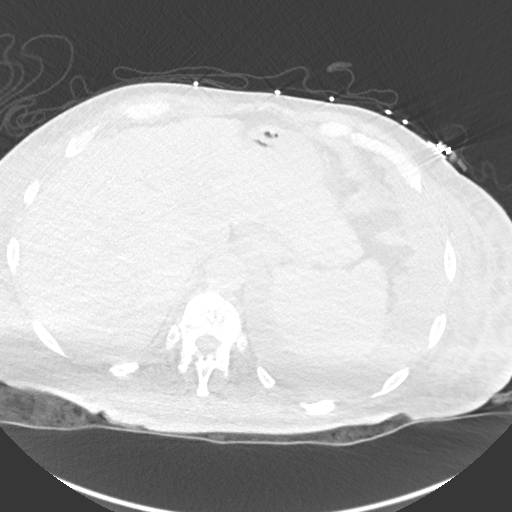
[im 53/158  lung]
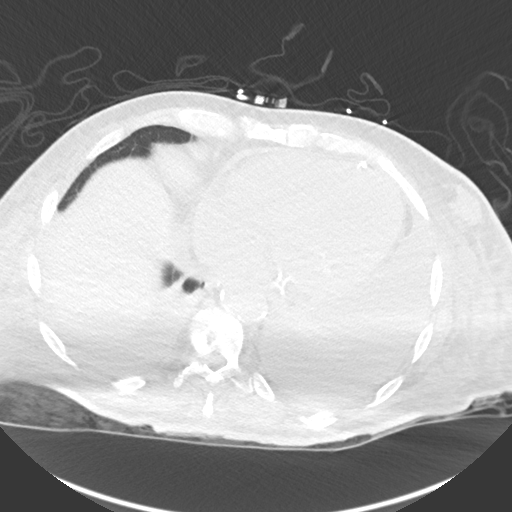
[im 63/158  mediastinal]
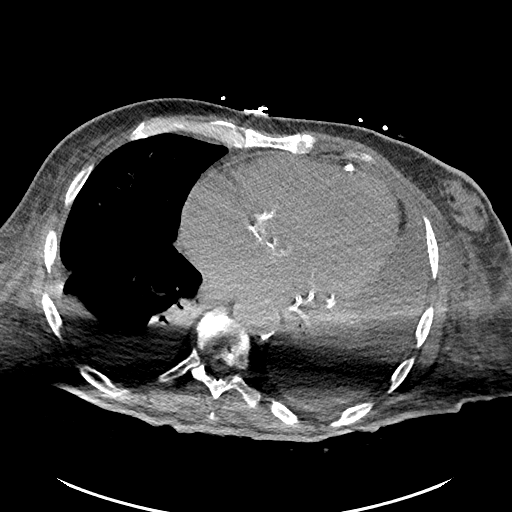
[im 63/158  lung]
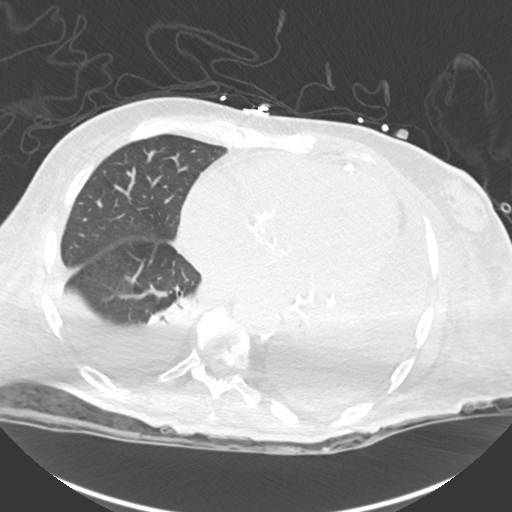
[im 74/158  lung]
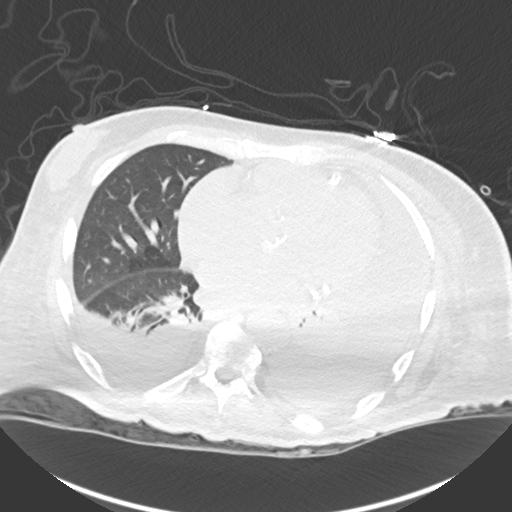
[im 84/158  lung]
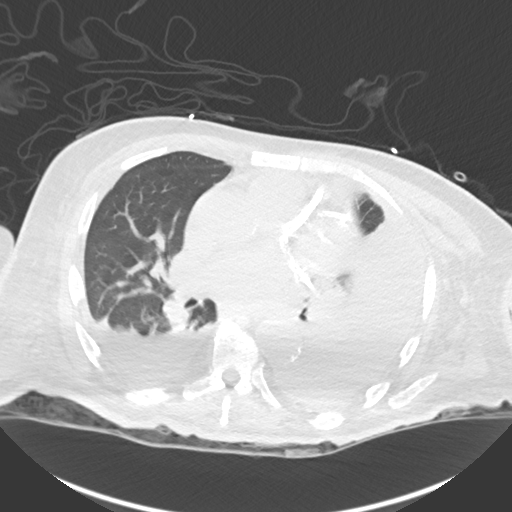
[im 95/158  lung]
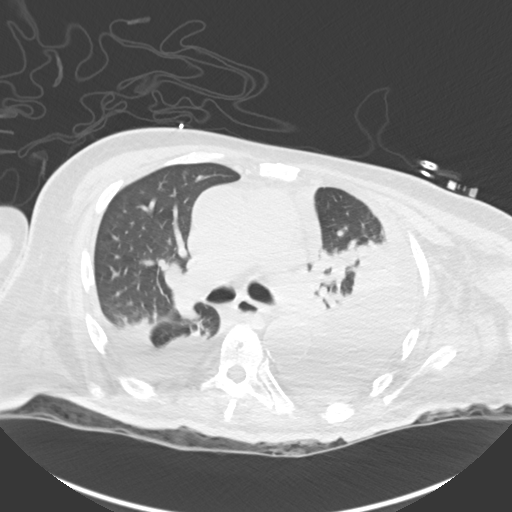
[im 105/158  mediastinal]
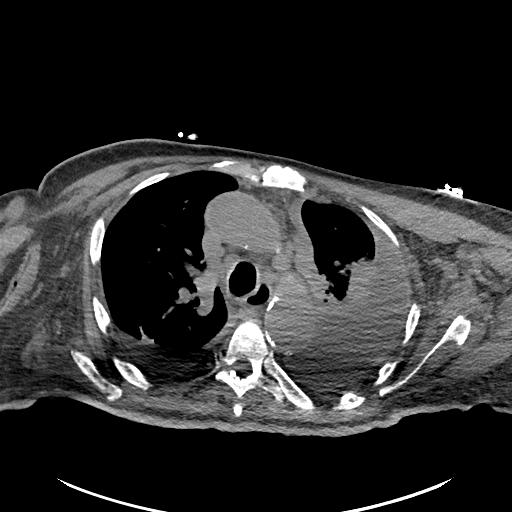
[im 105/158  lung]
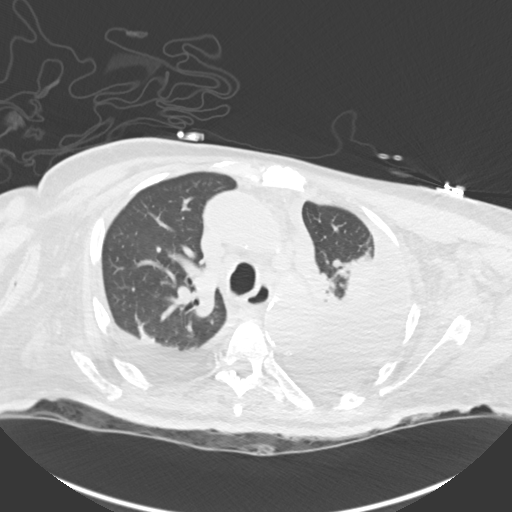
[im 126/158  lung]
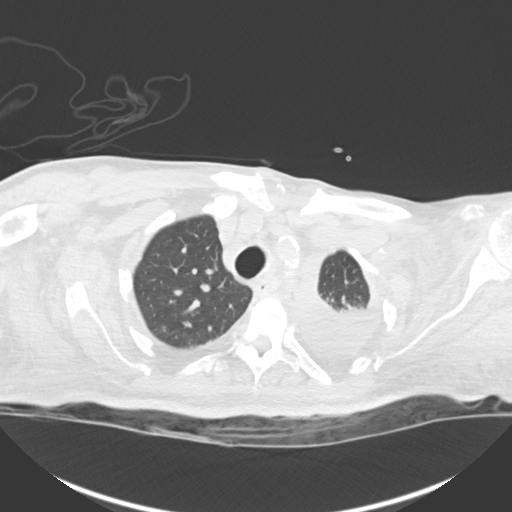
[im 137/158  lung]
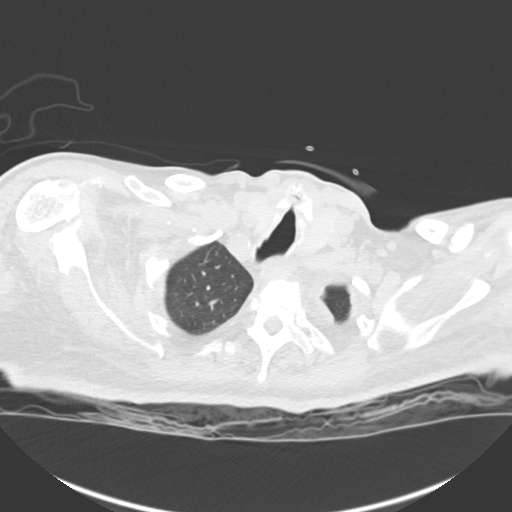
[im 147/158  lung]
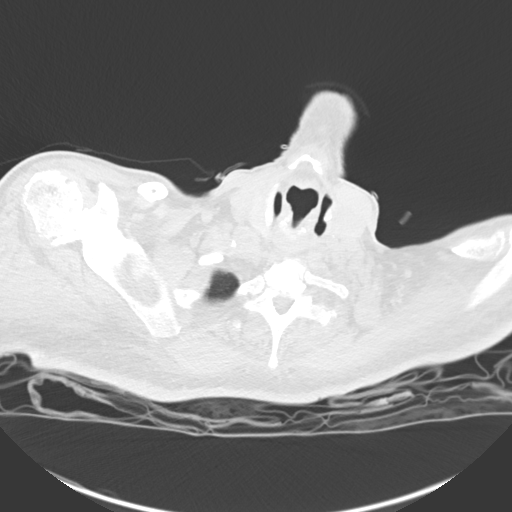

[Series 6: coronal · coronal · 0.62mm/px · 3 of 114 slices shown]
[im 23/114  lung]
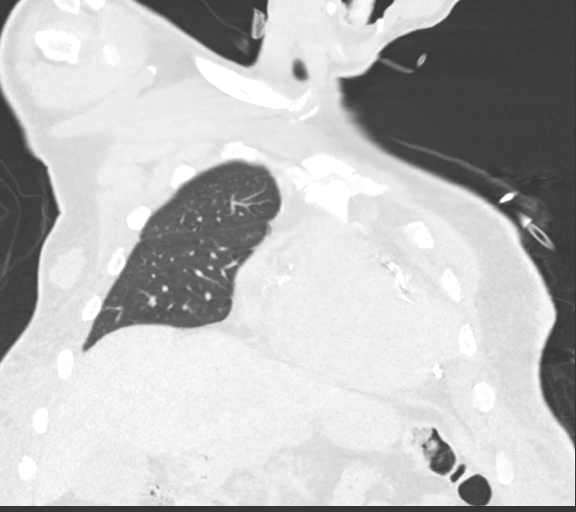
[im 46/114  lung]
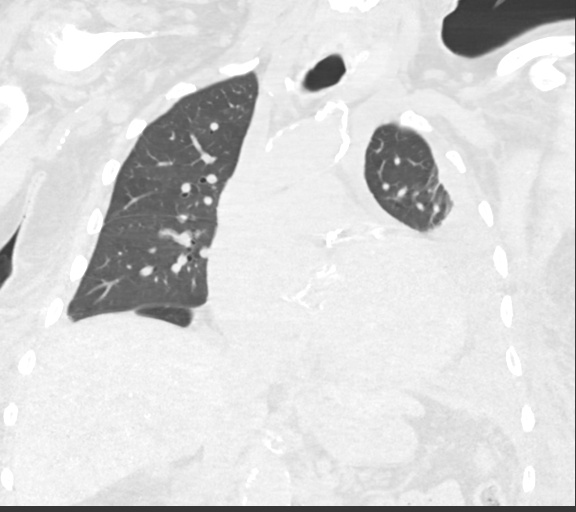
[im 68/114  lung]
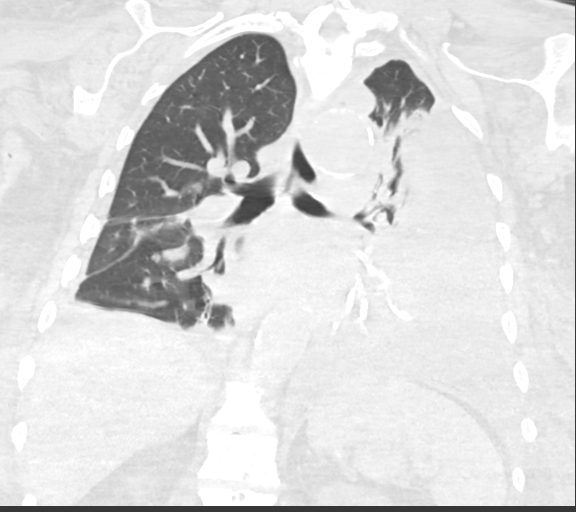

[15 of 36 positions shown; findings below may reference images not displayed]

FINDINGS: Cardiovascular: Atherosclerosis of thoracic aorta is noted. 4 cm
ascending thoracic aortic aneurysm is noted. Dissection could not be
evaluated for due to lack of intravenous contrast. Mild cardiomegaly
is noted. No pericardial effusion is noted. Extensive coronary
artery calcifications are noted.

Mediastinum/Nodes: No enlarged mediastinal or axillary lymph nodes.
Thyroid gland, trachea, and esophagus demonstrate no significant
findings.

Lungs/Pleura: No pneumothorax is noted. Large left pleural effusion
is noted with complete atelectasis of the left lower lobe and
significant atelectasis of the left upper lobe. Mild right pleural
effusion is noted with adjacent atelectasis of the right lower lobe.

Upper Abdomen: No acute abnormality.

Musculoskeletal: Increased sclerosis of visualized skeleton is noted
consistent with renal osteodystrophy.
IMPRESSION: Large left pleural effusion is noted with complete atelectasis of
the left lower lobe and significant atelectasis of the left upper
lobe. Small right pleural effusion is noted with adjacent
subsegmental atelectasis of the right lower lobe.

4 cm ascending thoracic aortic aneurysm. Recommend annual imaging
followup by CTA or MRA. This recommendation follows 5585
ACCF/AHA/AATS/ACR/ASA/SCA/BREHINER/FUCHEDJIEVA/CHE WAH/DAMIEN Guidelines for the
Diagnosis and Management of Patients with Thoracic Aortic Disease.
Circulation. 5585; 121: E266-e369. Aortic aneurysm NOS
([id number]).

Extensive coronary artery calcifications are noted concerning for
coronary artery disease.

Aortic Atherosclerosis ([id number]).

## 2020-11-03 IMAGING — US US EXTREM  UP VENOUS*L*
1 series · 13 of 24 positions shown · non-contrast
Comparison: None.

CLINICAL DATA: Left arm swelling. [id number] positive.



[Series 1: us extrem up venous*left* · 0.08mm/px · 13 of 33 slices shown]
[im 1/33]
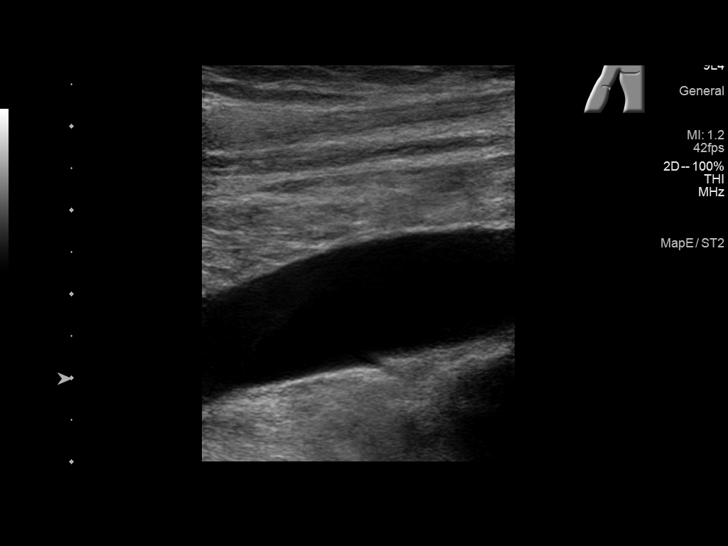
[im 3/33]
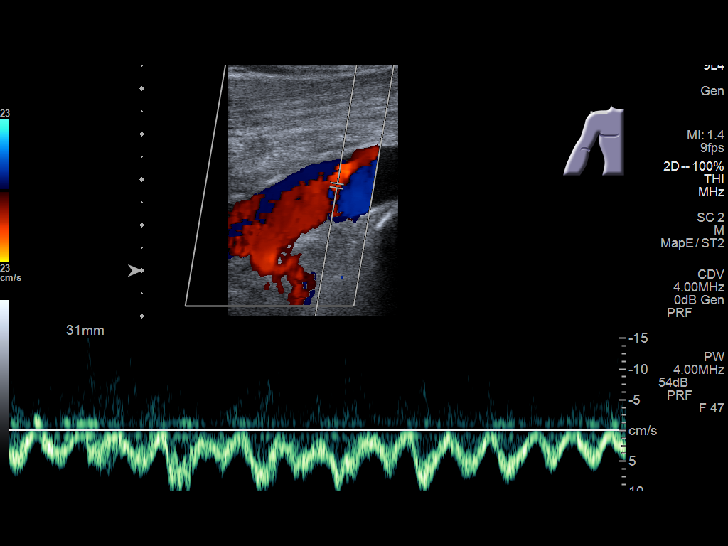
[im 6/33]
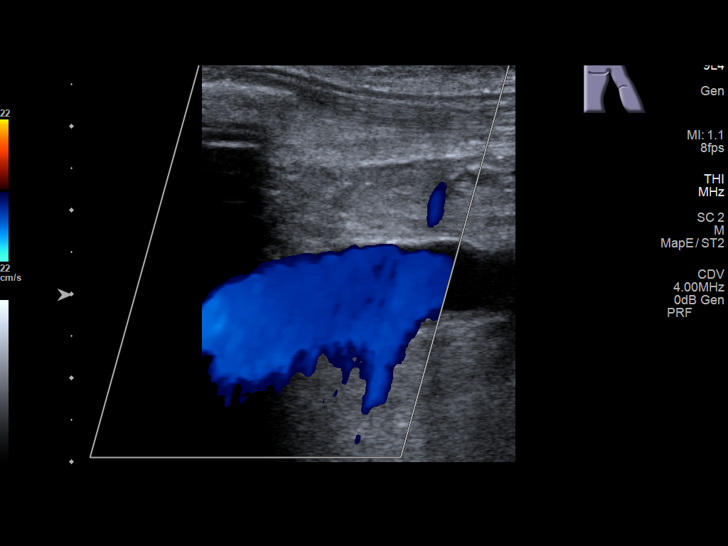
[im 9/33]
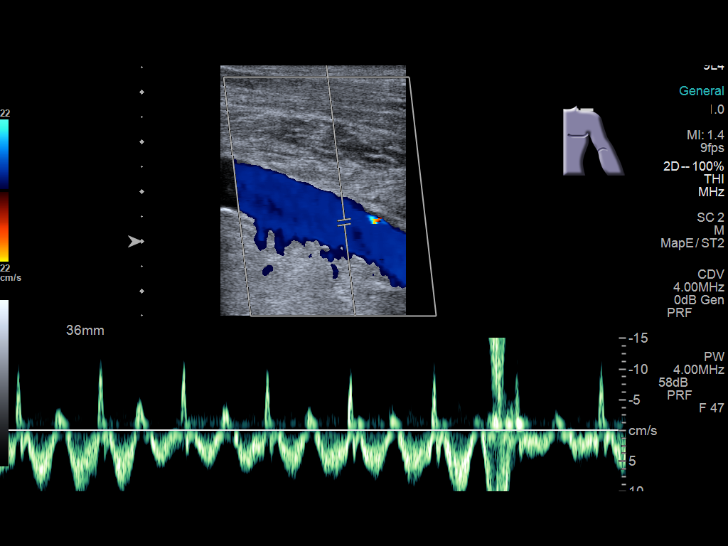
[im 12/33]
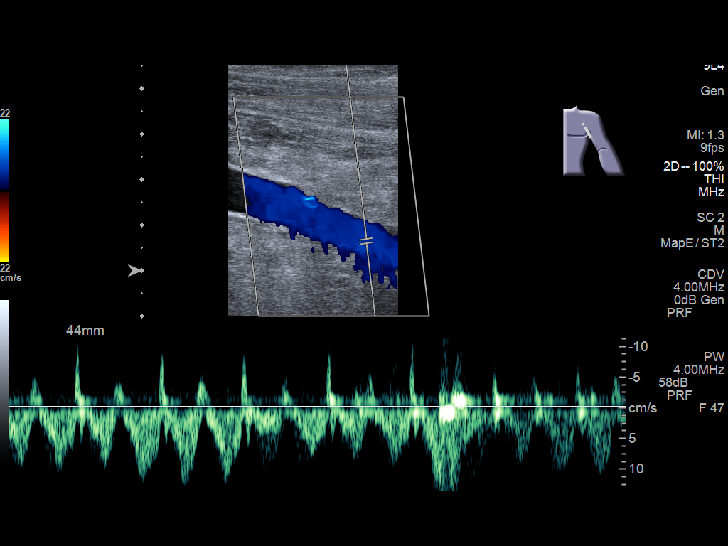
[im 14/33]
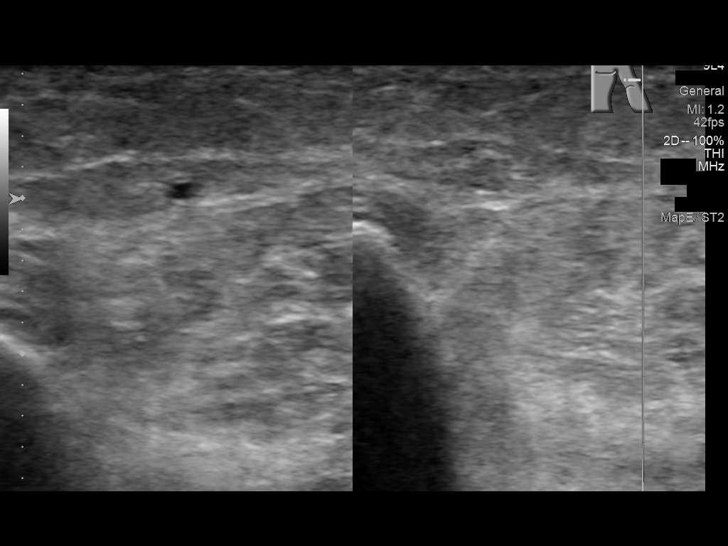
[im 17/33]
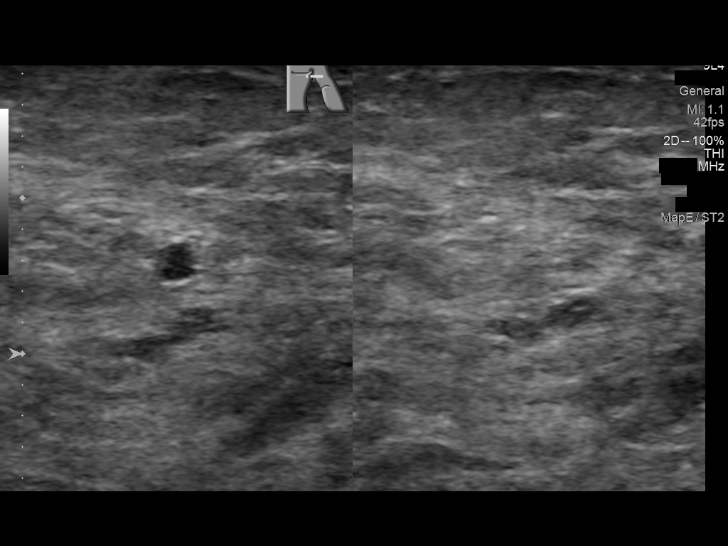
[im 19/33]
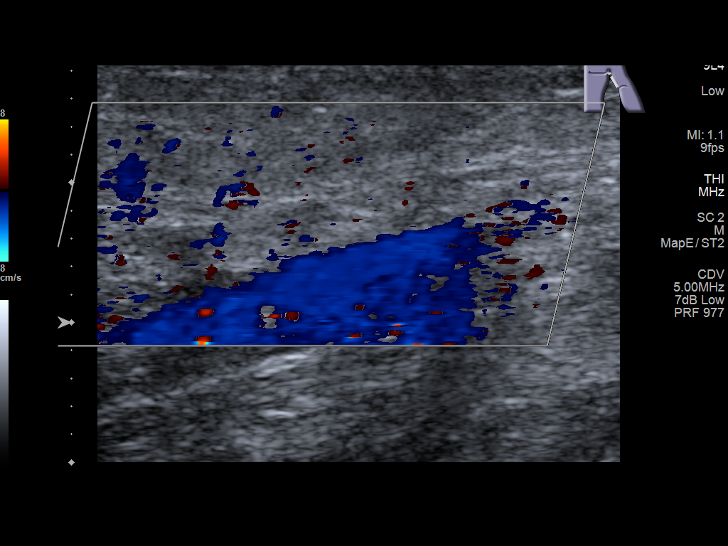
[im 21/33]
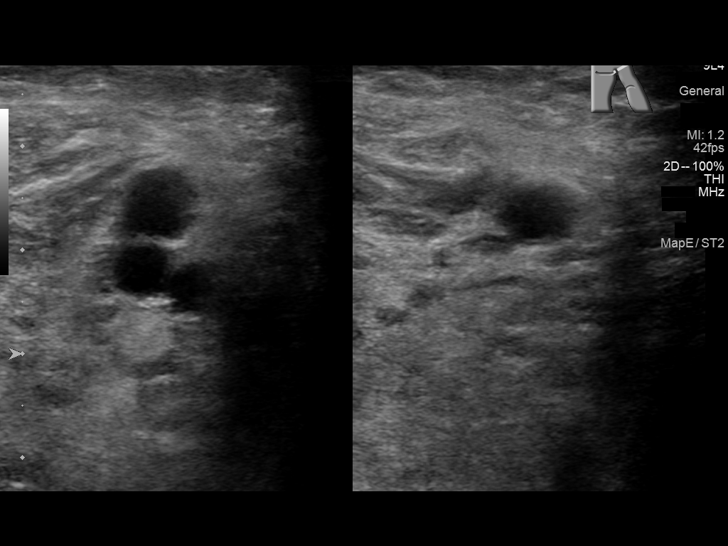
[im 24/33]
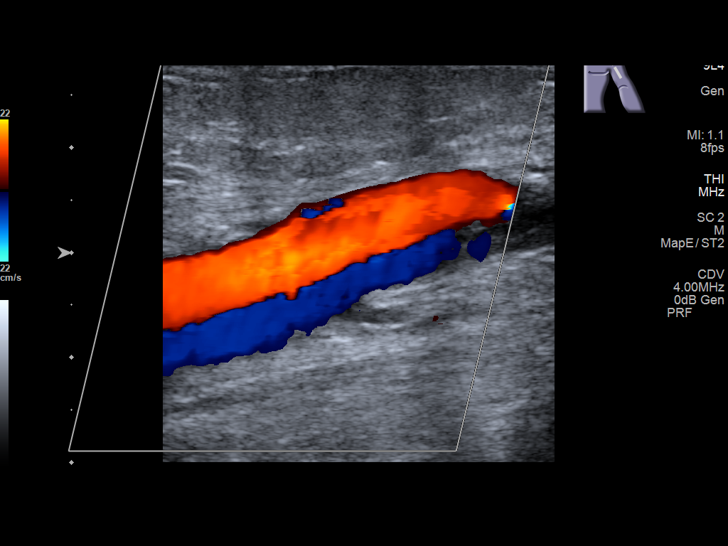
[im 27/33]
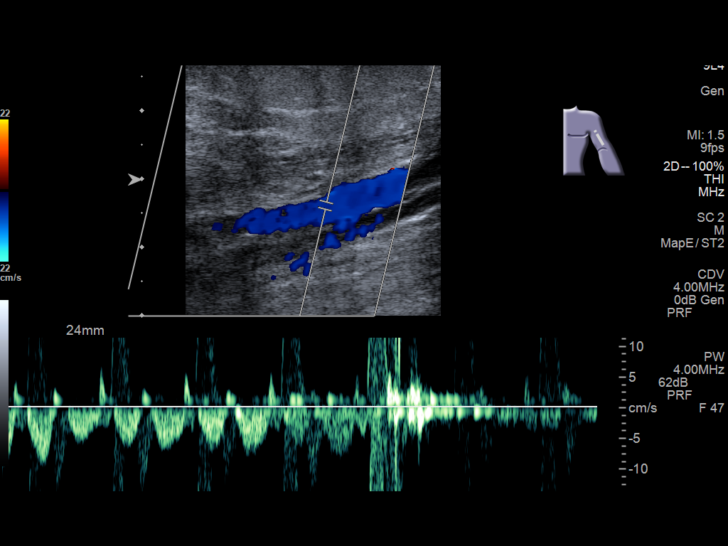
[im 30/33]
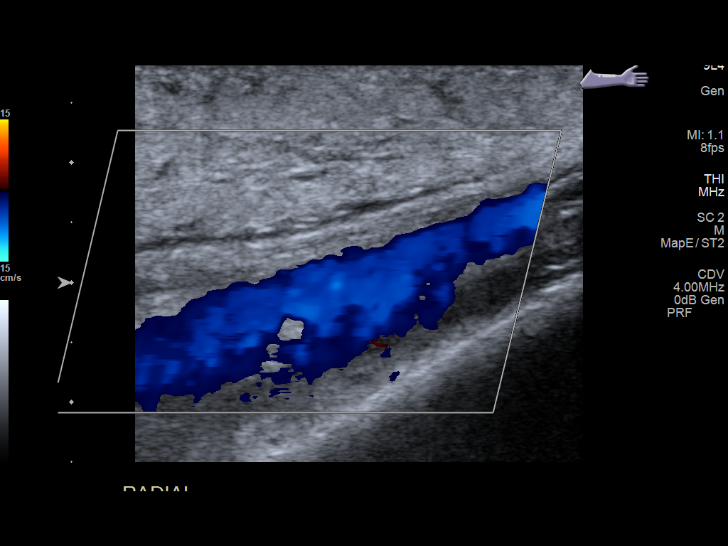
[im 33/33]
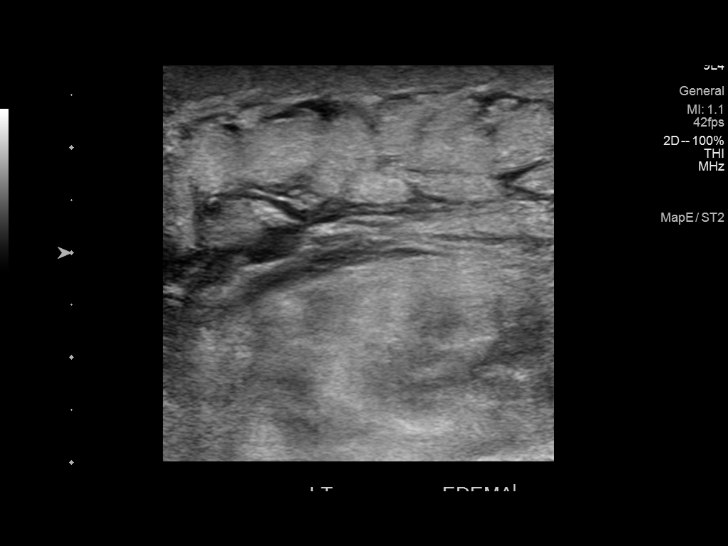

[13 of 24 positions shown; findings below may reference images not displayed]

FINDINGS: Contralateral Subclavian Vein: Respiratory phasicity is normal and
symmetric with the symptomatic side. No evidence of thrombus. Normal
compressibility.

Internal Jugular Vein: No evidence of thrombus. Normal
compressibility, respiratory phasicity and response to augmentation.

Subclavian Vein: No evidence of thrombus. Normal compressibility,
respiratory phasicity and response to augmentation.

Axillary Vein: No evidence of thrombus. Normal compressibility,
respiratory phasicity and response to augmentation.

Cephalic Vein: No evidence of thrombus. Normal compressibility,
respiratory phasicity and response to augmentation.

Basilic Vein: No evidence of thrombus. Normal compressibility,
respiratory phasicity and response to augmentation.

Brachial Veins: No evidence of thrombus. Normal compressibility,
respiratory phasicity and response to augmentation.

Radial Veins: No evidence of thrombus. Normal compressibility,
respiratory phasicity and response to augmentation.

Ulnar Veins: No evidence of thrombus. Normal compressibility,
respiratory phasicity and response to augmentation.

Venous Reflux:  None visualized.

Other Findings:  Subcutaneous edema noted in the forearm.
IMPRESSION: No evidence of DVT within the left upper extremity.
